# Patient Record
Sex: Male | Born: 1945 | Race: White | Hispanic: No | Marital: Married | Smoking: Never smoker
Health system: Southern US, Community
[De-identification: ages and names within clinical notes are randomized; demographics above are authoritative.]

## PROBLEM LIST (undated history)

## (undated) DIAGNOSIS — E7800 Pure hypercholesterolemia, unspecified: Secondary | ICD-10-CM

## (undated) DIAGNOSIS — I10 Essential (primary) hypertension: Secondary | ICD-10-CM

## (undated) DIAGNOSIS — Z87442 Personal history of urinary calculi: Secondary | ICD-10-CM

## (undated) DIAGNOSIS — R338 Other retention of urine: Secondary | ICD-10-CM

## (undated) DIAGNOSIS — K219 Gastro-esophageal reflux disease without esophagitis: Secondary | ICD-10-CM

## (undated) DIAGNOSIS — Q600 Renal agenesis, unilateral: Secondary | ICD-10-CM

## (undated) DIAGNOSIS — C4490 Unspecified malignant neoplasm of skin, unspecified: Secondary | ICD-10-CM

## (undated) DIAGNOSIS — Z923 Personal history of irradiation: Secondary | ICD-10-CM

## (undated) DIAGNOSIS — M1990 Unspecified osteoarthritis, unspecified site: Secondary | ICD-10-CM

## (undated) DIAGNOSIS — Z978 Presence of other specified devices: Secondary | ICD-10-CM

## (undated) DIAGNOSIS — N210 Calculus in bladder: Secondary | ICD-10-CM

## (undated) DIAGNOSIS — IMO0002 Reserved for concepts with insufficient information to code with codable children: Secondary | ICD-10-CM

## (undated) DIAGNOSIS — C61 Malignant neoplasm of prostate: Secondary | ICD-10-CM

## (undated) DIAGNOSIS — Z86711 Personal history of pulmonary embolism: Secondary | ICD-10-CM

## (undated) DIAGNOSIS — R7303 Prediabetes: Secondary | ICD-10-CM

## (undated) DIAGNOSIS — Z960 Presence of urogenital implants: Secondary | ICD-10-CM

## (undated) HISTORY — PX: NEPHRECTOMY: SHX65

## (undated) HISTORY — PX: SURGERY OF LIP: SUR1315

---

## 2000-04-10 DIAGNOSIS — Z86711 Personal history of pulmonary embolism: Secondary | ICD-10-CM

## 2000-04-10 HISTORY — DX: Personal history of pulmonary embolism: Z86.711

## 2000-04-10 HISTORY — PX: OTHER SURGICAL HISTORY: SHX169

## 2004-06-29 ENCOUNTER — Emergency Department (HOSPITAL_COMMUNITY): Admission: EM | Admit: 2004-06-29 | Discharge: 2004-06-29 | Payer: Self-pay | Admitting: Emergency Medicine

## 2012-03-29 ENCOUNTER — Emergency Department (HOSPITAL_COMMUNITY)
Admission: EM | Admit: 2012-03-29 | Discharge: 2012-03-29 | Disposition: A | Discharge status: Home / Self Care | Payer: Managed Care, Other (non HMO) | Attending: Emergency Medicine | Admitting: Emergency Medicine

## 2012-03-29 ENCOUNTER — Encounter (HOSPITAL_COMMUNITY): Payer: Self-pay | Admitting: Family Medicine

## 2012-03-29 DIAGNOSIS — R350 Frequency of micturition: Secondary | ICD-10-CM | POA: Insufficient documentation

## 2012-03-29 DIAGNOSIS — Z87448 Personal history of other diseases of urinary system: Secondary | ICD-10-CM | POA: Insufficient documentation

## 2012-03-29 DIAGNOSIS — R300 Dysuria: Secondary | ICD-10-CM | POA: Insufficient documentation

## 2012-03-29 DIAGNOSIS — R339 Retention of urine, unspecified: Secondary | ICD-10-CM | POA: Insufficient documentation

## 2012-03-29 DIAGNOSIS — N390 Urinary tract infection, site not specified: Secondary | ICD-10-CM | POA: Insufficient documentation

## 2012-03-29 DIAGNOSIS — Z79899 Other long term (current) drug therapy: Secondary | ICD-10-CM | POA: Insufficient documentation

## 2012-03-29 DIAGNOSIS — I10 Essential (primary) hypertension: Secondary | ICD-10-CM | POA: Insufficient documentation

## 2012-03-29 HISTORY — DX: Essential (primary) hypertension: I10

## 2012-03-29 LAB — URINALYSIS, ROUTINE W REFLEX MICROSCOPIC
Protein, ur: 30 mg/dL — AB
Urobilinogen, UA: 0.2 mg/dL (ref 0.0–1.0)

## 2012-03-29 LAB — URINE MICROSCOPIC-ADD ON

## 2012-03-29 MED ORDER — CIPROFLOXACIN HCL 500 MG PO TABS
500.0000 mg | ORAL_TABLET | Freq: Once | ORAL | Status: AC
  Administered 2012-03-29: 500 mg via ORAL
  Filled 2012-03-29: qty 1

## 2012-03-29 MED ORDER — CIPROFLOXACIN HCL 500 MG PO TABS: 500.0000 mg | ORAL_TABLET | Freq: Two times a day (BID) | ORAL | Status: DC

## 2012-03-29 NOTE — ED Provider Notes (Signed)
History     CSN: [id number]  Arrival date & time 03/29/12  0142   First MD Initiated Contact with Patient 03/29/12 [phone]      Chief Complaint  Patient presents with  . Urinary Retention    (Consider location/radiation/quality/duration/timing/severity/associated sxs/prior treatment) HPI This is a 66 year old male who complains of difficulty urinating beginning yesterday. He characterized it as "squirting small amounts" of urine with urinary frequency. About midnight he became obstructed and was unable to void at all. He developed severe pain in his bladder and came to the ED. A Foley catheter was placed by nursing staff on arrival and he had significant relief of his symptoms. He states he had this occur 3 years ago as a result of her urinary tract infection. He denies fever, chills, nausea, vomiting or diarrhea. He did have some diaphoresis several hours ago. He did have some pain with urination yesterday which makes him think he has a urinary tract infection.  Past Medical History  Diagnosis Date  . Hypertension   . 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 Nephrectomy     No family history on file.  History  Substance Use Topics  . Smoking status: Never Smoker   . Smokeless tobacco: Not on file  . Alcohol Use: Yes     Comment: Occasional      Review of Systems  All other systems reviewed and are negative.    Allergies  Review of patient's allergies indicates no known allergies.  Home Medications   Current Outpatient Rx  Name  Route  Sig  Dispense  Refill  . AMLODIPINE BESYLATE 10 MG PO TABS   Oral   Take 10 mg by mouth daily.         Marland Kitchen TAMSULOSIN HCL 0.4 MG PO CAPS   Oral   Take 0.4 mg by mouth daily after supper.         . TRIAMTERENE-HCTZ 37.5-25 MG PO TABS   Oral   Take 1 tablet by mouth daily.           BP 156/90  Pulse 126  Temp 97.6 F (36.4 C) (Oral)  Resp 24  SpO2 100%  Physical  Exam General: Well-developed, well-nourished male in no acute distress; appearance consistent with age of record HENT: normocephalic, atraumatic Eyes: pupils equal round and reactive to light; extraocular muscles intact Neck: supple Heart: regular rate and rhythm Lungs: clear to auscultation bilaterally Abdomen: soft; nondistended; nontender; bowel sounds present GU: Foley catheter draining dark somewhat cloudy urine Extremities: No deformity; full range of motion Neurologic: Awake, alert and oriented; motor function intact in all extremities and symmetric; no facial droop Skin: Warm and dry Psychiatric: Normal mood and affect    ED Course  Procedures (including critical care time)     MDM   Nursing notes and vitals signs, including pulse oximetry, reviewed.  Summary of this visit's results, reviewed by myself:  Labs:  Results for orders placed during the hospital encounter of 03/29/12 (from the past 24 hour(s))  URINALYSIS, ROUTINE W REFLEX MICROSCOPIC     Status: Abnormal   Collection Time   03/29/12  2:01 AM      Component Value Range   Color, Urine YELLOW  YELLOW   APPearance CLOUDY (*) CLEAR   Specific Gravity, Urine 1.021  1.005 - 1.030   pH 5.5  5.0 - 8.0   Glucose, UA NEGATIVE  NEGATIVE mg/dL   Hgb  urine dipstick MODERATE (*) NEGATIVE   Bilirubin Urine NEGATIVE  NEGATIVE   Ketones, ur NEGATIVE  NEGATIVE mg/dL   Protein, ur 30 (*) NEGATIVE mg/dL   Urobilinogen, UA 0.2  0.0 - 1.0 mg/dL   Nitrite NEGATIVE  NEGATIVE   Leukocytes, UA LARGE (*) NEGATIVE  URINE MICROSCOPIC-ADD ON     Status: Abnormal   Collection Time   03/29/12  2:01 AM      Component Value Range   Squamous Epithelial / LPF FEW (*) RARE   WBC, UA 21-50  <3 WBC/hpf   RBC / HPF 11-20  <3 RBC/hpf   Bacteria, UA MANY (*) RARE   4:32 AM We'll treat for urinary tract infection. Patient will follow up with Dr. Brunilda Payor.          Hanley Seamen, MD 03/29/12 [phone]

## 2012-03-29 NOTE — ED Notes (Signed)
Patient states that he thinks he may have a urinary tract infection. States that for the past couple of hours he has been having urinary retention. Denies low back pain or fever. Had similar episode approx 3 years ago. Foley catheter placed at triage; drainage dark amber urine; specimen sent to lab.

## 2012-03-31 LAB — URINE CULTURE

## 2012-04-01 NOTE — ED Notes (Signed)
+   Urine Patient treated Cipro -sensitive to same-chart appended per protocol MD. 

## 2012-04-29 ENCOUNTER — Other Ambulatory Visit: Payer: Self-pay | Admitting: Urology

## 2012-05-22 ENCOUNTER — Encounter (HOSPITAL_BASED_OUTPATIENT_CLINIC_OR_DEPARTMENT_OTHER): Payer: Self-pay | Admitting: *Deleted

## 2012-05-22 NOTE — Progress Notes (Signed)
NPO AFTER MN. ARRIVES AT 1045. NEEDS ISTAT AND EKG. WILL TAKE NORVASC AM OF SURG W/ SIP OF WATER. REVIEWED [facility] GUIDELINES.

## 2012-05-27 ENCOUNTER — Encounter (HOSPITAL_BASED_OUTPATIENT_CLINIC_OR_DEPARTMENT_OTHER): Payer: Self-pay | Admitting: *Deleted

## 2012-05-27 ENCOUNTER — Encounter (HOSPITAL_BASED_OUTPATIENT_CLINIC_OR_DEPARTMENT_OTHER)
Admission: RE | Disposition: A | Discharge status: Home / Self Care | Payer: Self-pay | Source: Ambulatory Visit | Attending: Urology

## 2012-05-27 ENCOUNTER — Ambulatory Visit (HOSPITAL_BASED_OUTPATIENT_CLINIC_OR_DEPARTMENT_OTHER): Payer: BC Managed Care – PPO | Admitting: Anesthesiology

## 2012-05-27 ENCOUNTER — Encounter (HOSPITAL_BASED_OUTPATIENT_CLINIC_OR_DEPARTMENT_OTHER): Payer: Self-pay | Admitting: Anesthesiology

## 2012-05-27 ENCOUNTER — Ambulatory Visit (HOSPITAL_BASED_OUTPATIENT_CLINIC_OR_DEPARTMENT_OTHER)
Admission: RE | Admit: 2012-05-27 | Discharge: 2012-05-28 | Disposition: A | Discharge status: Home / Self Care | Payer: BC Managed Care – PPO | Source: Ambulatory Visit | Attending: Urology | Admitting: Urology

## 2012-05-27 ENCOUNTER — Other Ambulatory Visit: Payer: Self-pay

## 2012-05-27 DIAGNOSIS — K219 Gastro-esophageal reflux disease without esophagitis: Secondary | ICD-10-CM | POA: Insufficient documentation

## 2012-05-27 DIAGNOSIS — N4289 Other specified disorders of prostate: Secondary | ICD-10-CM | POA: Insufficient documentation

## 2012-05-27 DIAGNOSIS — I10 Essential (primary) hypertension: Secondary | ICD-10-CM | POA: Insufficient documentation

## 2012-05-27 DIAGNOSIS — N138 Other obstructive and reflux uropathy: Secondary | ICD-10-CM | POA: Insufficient documentation

## 2012-05-27 DIAGNOSIS — Z79899 Other long term (current) drug therapy: Secondary | ICD-10-CM | POA: Insufficient documentation

## 2012-05-27 DIAGNOSIS — E7800 Pure hypercholesterolemia, unspecified: Secondary | ICD-10-CM | POA: Insufficient documentation

## 2012-05-27 DIAGNOSIS — N210 Calculus in bladder: Secondary | ICD-10-CM | POA: Insufficient documentation

## 2012-05-27 DIAGNOSIS — N3289 Other specified disorders of bladder: Secondary | ICD-10-CM | POA: Insufficient documentation

## 2012-05-27 DIAGNOSIS — N401 Enlarged prostate with lower urinary tract symptoms: Secondary | ICD-10-CM | POA: Insufficient documentation

## 2012-05-27 DIAGNOSIS — R972 Elevated prostate specific antigen [PSA]: Secondary | ICD-10-CM | POA: Insufficient documentation

## 2012-05-27 DIAGNOSIS — Z8673 Personal history of transient ischemic attack (TIA), and cerebral infarction without residual deficits: Secondary | ICD-10-CM | POA: Insufficient documentation

## 2012-05-27 DIAGNOSIS — R339 Retention of urine, unspecified: Secondary | ICD-10-CM | POA: Insufficient documentation

## 2012-05-27 HISTORY — DX: Calculus in bladder: N21.0

## 2012-05-27 HISTORY — PX: CYSTOSCOPY: SHX5120

## 2012-05-27 HISTORY — PX: TRANSURETHRAL RESECTION OF PROSTATE: SHX73

## 2012-05-27 HISTORY — DX: Personal history of urinary calculi: Z87.442

## 2012-05-27 HISTORY — DX: Presence of urogenital implants: Z96.0

## 2012-05-27 HISTORY — DX: Reserved for concepts with insufficient information to code with codable children: IMO0002

## 2012-05-27 HISTORY — DX: Renal agenesis, unilateral: Q60.0

## 2012-05-27 HISTORY — DX: Gastro-esophageal reflux disease without esophagitis: K21.9

## 2012-05-27 HISTORY — DX: Presence of other specified devices: Z97.8

## 2012-05-27 HISTORY — DX: Other retention of urine: R33.8

## 2012-05-27 HISTORY — DX: Personal history of pulmonary embolism: Z86.711

## 2012-05-27 SURGERY — TRANSURETHRAL RESECTION OF THE PROSTATE WITH GYRUS INSTRUMENTS
Anesthesia: General | Site: Prostate | Wound class: Clean Contaminated

## 2012-05-27 MED ORDER — LACTATED RINGERS IV SOLN
INTRAVENOUS | Status: DC
  Administered 2012-05-27: 11:00:00 via INTRAVENOUS
  Filled 2012-05-27: qty 1000

## 2012-05-27 MED ORDER — DEXTROSE-NACL 5-0.45 % IV SOLN
INTRAVENOUS | Status: DC
  Administered 2012-05-27 – 2012-05-28 (×2): via INTRAVENOUS
  Filled 2012-05-27: qty 1000

## 2012-05-27 MED ORDER — MEPERIDINE HCL 25 MG/ML IJ SOLN
6.2500 mg | INTRAMUSCULAR | Status: DC | PRN
  Filled 2012-05-27: qty 1

## 2012-05-27 MED ORDER — HYDROCODONE-ACETAMINOPHEN 5-325 MG PO TABS
1.0000 | ORAL_TABLET | ORAL | Status: DC | PRN
  Filled 2012-05-27: qty 2

## 2012-05-27 MED ORDER — ZOLPIDEM TARTRATE 5 MG PO TABS
5.0000 mg | ORAL_TABLET | Freq: Every evening | ORAL | Status: DC | PRN
  Filled 2012-05-27: qty 1

## 2012-05-27 MED ORDER — LIDOCAINE HCL 2 % EX GEL
CUTANEOUS | Status: DC | PRN
  Administered 2012-05-27: 1 via URETHRAL

## 2012-05-27 MED ORDER — BELLADONNA ALKALOIDS-OPIUM 16.2-60 MG RE SUPP
RECTAL | Status: DC | PRN
  Administered 2012-05-27: 1 via RECTAL

## 2012-05-27 MED ORDER — ACETAMINOPHEN 10 MG/ML IV SOLN
1000.0000 mg | Freq: Once | INTRAVENOUS | Status: AC | PRN
  Filled 2012-05-27: qty 100

## 2012-05-27 MED ORDER — DEXAMETHASONE SODIUM PHOSPHATE 4 MG/ML IJ SOLN
INTRAMUSCULAR | Status: DC | PRN
  Administered 2012-05-27: 10 mg via INTRAVENOUS

## 2012-05-27 MED ORDER — PROMETHAZINE HCL 25 MG/ML IJ SOLN
6.2500 mg | INTRAMUSCULAR | Status: DC | PRN
  Filled 2012-05-27: qty 1

## 2012-05-27 MED ORDER — ACETAMINOPHEN 325 MG PO TABS
650.0000 mg | ORAL_TABLET | ORAL | Status: DC | PRN
  Filled 2012-05-27: qty 2

## 2012-05-27 MED ORDER — MIDAZOLAM HCL 5 MG/5ML IJ SOLN
INTRAMUSCULAR | Status: DC | PRN
  Administered 2012-05-27: 2 mg via INTRAVENOUS

## 2012-05-27 MED ORDER — SODIUM CHLORIDE 0.9 % IR SOLN
Status: DC | PRN
  Administered 2012-05-27: 30000 mL

## 2012-05-27 MED ORDER — PROPOFOL 10 MG/ML IV BOLUS
INTRAVENOUS | Status: DC | PRN
  Administered 2012-05-27: 60 mg via INTRAVENOUS
  Administered 2012-05-27: 200 mg via INTRAVENOUS

## 2012-05-27 MED ORDER — CIPROFLOXACIN IN D5W 400 MG/200ML IV SOLN
400.0000 mg | INTRAVENOUS | Status: AC
  Administered 2012-05-27: 400 mg via INTRAVENOUS
  Filled 2012-05-27: qty 200

## 2012-05-27 MED ORDER — LIDOCAINE HCL (CARDIAC) 20 MG/ML IV SOLN
INTRAVENOUS | Status: DC | PRN
  Administered 2012-05-27: 100 mg via INTRAVENOUS

## 2012-05-27 MED ORDER — OXYCODONE HCL 5 MG/5ML PO SOLN
5.0000 mg | Freq: Once | ORAL | Status: AC | PRN
  Filled 2012-05-27: qty 5

## 2012-05-27 MED ORDER — LACTATED RINGERS IV SOLN
INTRAVENOUS | Status: DC | PRN
  Administered 2012-05-27 (×3): via INTRAVENOUS

## 2012-05-27 MED ORDER — ONDANSETRON HCL 4 MG/2ML IJ SOLN
INTRAMUSCULAR | Status: DC | PRN
  Administered 2012-05-27: 4 mg via INTRAVENOUS

## 2012-05-27 MED ORDER — CIPROFLOXACIN IN D5W 400 MG/200ML IV SOLN
400.0000 mg | Freq: Two times a day (BID) | INTRAVENOUS | Status: AC
  Administered 2012-05-27: 400 mg via INTRAVENOUS
  Filled 2012-05-27: qty 200

## 2012-05-27 MED ORDER — FENTANYL CITRATE 0.05 MG/ML IJ SOLN
INTRAMUSCULAR | Status: DC | PRN
  Administered 2012-05-27 (×2): 25 ug via INTRAVENOUS
  Administered 2012-05-27: 50 ug via INTRAVENOUS
  Administered 2012-05-27 (×4): 25 ug via INTRAVENOUS
  Administered 2012-05-27 (×2): 50 ug via INTRAVENOUS

## 2012-05-27 MED ORDER — HYDROMORPHONE HCL PF 1 MG/ML IJ SOLN
0.2500 mg | INTRAMUSCULAR | Status: DC | PRN
  Filled 2012-05-27: qty 1

## 2012-05-27 MED ORDER — OXYCODONE HCL 5 MG PO TABS
5.0000 mg | ORAL_TABLET | Freq: Once | ORAL | Status: AC | PRN
  Filled 2012-05-27: qty 1

## 2012-05-27 MED ORDER — BELLADONNA ALKALOIDS-OPIUM 16.2-60 MG RE SUPP
1.0000 | Freq: Four times a day (QID) | RECTAL | Status: DC | PRN
  Filled 2012-05-27: qty 1

## 2012-05-27 MED ORDER — KETOROLAC TROMETHAMINE 30 MG/ML IJ SOLN
INTRAMUSCULAR | Status: DC | PRN
  Administered 2012-05-27: 30 mg via INTRAVENOUS

## 2012-05-27 SURGICAL SUPPLY — 40 items
BAG DRAIN URO-CYSTO SKYTR STRL (DRAIN) ×3 IMPLANT
BAG DRN ANRFLXCHMBR STRAP LEK (BAG)
BAG DRN UROCATH (DRAIN) ×2
BAG URINE DRAINAGE (UROLOGICAL SUPPLIES) IMPLANT
BAG URINE LEG 19OZ MD ST LTX (BAG) IMPLANT
CANISTER SUCT LVC 12 LTR MEDI- (MISCELLANEOUS) ×3 IMPLANT
CATH FOLEY 2WAY SLVR  5CC 20FR (CATHETERS)
CATH FOLEY 2WAY SLVR  5CC 22FR (CATHETERS)
CATH FOLEY 2WAY SLVR 5CC 20FR (CATHETERS) IMPLANT
CATH FOLEY 2WAY SLVR 5CC 22FR (CATHETERS) IMPLANT
CATH FOLEY 3WAY 30CC 24FR (CATHETERS)
CATH HEMA 3WAY 30CC 24FR COUDE (CATHETERS) IMPLANT
CATH HEMA 3WAY 30CC 24FR RND (CATHETERS) IMPLANT
CATH URTH STD 24FR FL 3W 2 (CATHETERS) IMPLANT
CLOTH BEACON ORANGE TIMEOUT ST (SAFETY) ×3 IMPLANT
DRAPE CAMERA CLOSED 9X96 (DRAPES) ×1 IMPLANT
ELECT LOOP MED HF 24F 12D (CUTTING LOOP) ×1 IMPLANT
ELECT LOOP MED HF 24F 12D CBL (CLIP) ×3 IMPLANT
ELECT REM PT RETURN 9FT ADLT (ELECTROSURGICAL)
ELECT RESECT VAPORIZE 12D CBL (ELECTRODE) ×3 IMPLANT
ELECTRODE REM PT RTRN 9FT ADLT (ELECTROSURGICAL) ×2 IMPLANT
EVACUATOR MICROVAS BLADDER (UROLOGICAL SUPPLIES) ×3 IMPLANT
GLOVE BIO SURGEON STRL SZ7 (GLOVE) ×3 IMPLANT
GLOVE BIOGEL PI IND STRL 6.5 (GLOVE) IMPLANT
GLOVE BIOGEL PI IND STRL 7.0 (GLOVE) IMPLANT
GLOVE BIOGEL PI INDICATOR 6.5 (GLOVE) ×1
GLOVE BIOGEL PI INDICATOR 7.0 (GLOVE) ×1
HOLDER FOLEY CATH W/STRAP (MISCELLANEOUS) IMPLANT
IV NS IRRIG 3000ML ARTHROMATIC (IV SOLUTION) ×10 IMPLANT
KIT ASPIRATION TUBING (SET/KITS/TRAYS/PACK) IMPLANT
NDL SAFETY ECLIPSE 18X1.5 (NEEDLE) IMPLANT
NEEDLE HYPO 18GX1.5 SHARP (NEEDLE)
NEEDLE HYPO 22GX1.5 SAFETY (NEEDLE) IMPLANT
NS IRRIG 500ML POUR BTL (IV SOLUTION) IMPLANT
PACK CYSTOSCOPY (CUSTOM PROCEDURE TRAY) ×3 IMPLANT
PLUG CATH AND CAP STER (CATHETERS) IMPLANT
SET ASPIRATION TUBING (TUBING) ×1 IMPLANT
SYR 20CC LL (SYRINGE) IMPLANT
SYR 30ML LL (SYRINGE) IMPLANT
WATER STERILE IRR 3000ML UROMA (IV SOLUTION) ×2 IMPLANT

## 2012-05-27 NOTE — Anesthesia Preprocedure Evaluation (Addendum)
Anesthesia Evaluation  Patient identified by MRN, date of birth, ID band Patient awake    Reviewed: Allergy & Precautions, H&P , NPO status , Patient's Chart, lab work & pertinent test results  Airway Mallampati: II TM Distance: >3 FB Neck ROM: Full    Dental  (+) Dental Advisory Given and Teeth Intact   Pulmonary PE breath sounds clear to auscultation  Pulmonary exam normal       Cardiovascular hypertension, Pt. on medications negative cardio ROS  Rhythm:Regular Rate:Normal     Neuro/Psych negative neurological ROS  negative psych ROS   GI/Hepatic negative GI ROS, Neg liver ROS, GERD-  ,  Endo/Other  negative endocrine ROS  Renal/GU negative Renal ROS     Musculoskeletal negative musculoskeletal ROS (+)   Abdominal   Peds  Hematology negative hematology ROS (+)   Anesthesia Other Findings   Reproductive/Obstetrics                         Anesthesia Physical Anesthesia Plan  ASA: II  Anesthesia Plan: General   Post-op Pain Management:    Induction: Intravenous  Airway Management Planned: LMA  Additional Equipment:   Intra-op Plan:   Post-operative Plan: Extubation in OR  Informed Consent: I have reviewed the patients History and Physical, chart, labs and discussed the procedure including the risks, benefits and alternatives for the proposed anesthesia with the patient or authorized representative who has indicated his/her understanding and acceptance.   Dental advisory given  Plan Discussed with: CRNA  Anesthesia Plan Comments:         Anesthesia Quick Evaluation

## 2012-05-27 NOTE — Progress Notes (Signed)
Post-Op rounds Afebrile.  V/S stable. Tolerates diet well. Abdomen: soft, non distended, non tender. Foley draining well.  Urine rose. Plan: D/C CBI. Remove Foley in AM.

## 2012-05-27 NOTE — Progress Notes (Signed)
Assessment completed. Patient alert able to verbalize needs  Foley patent draining clear pink urine. Taking p.o"s ok. Call light in reach

## 2012-05-27 NOTE — Transfer of Care (Signed)
Immediate Anesthesia Transfer of Care Note  Patient: Benjamin Massey  Procedure(s) Performed: Procedure(s) (LRB): TRANSURETHRAL RESECTION OF THE PROSTATE WITH GYRUS INSTRUMENTS (N/A) CYSTOSCOPY (N/A)  Patient Location: PACU  Anesthesia Type: General  Level of Consciousness: awake, sedated, patient cooperative and responds to stimulation  Airway & Oxygen Therapy: Patient Spontanous Breathing and Patient connected to face mask oxygen  Post-op Assessment: Report given to PACU RN, Post -op Vital signs reviewed and stable and Patient moving all extremities  Post vital signs: Reviewed and stable  Complications: No apparent anesthesia complications

## 2012-05-27 NOTE — Anesthesia Procedure Notes (Signed)
Procedure Name: LMA Insertion Date/Time: 05/27/2012 12:19 PM Performed by: Jessica Priest Pre-anesthesia Checklist: Patient identified, Emergency Drugs available, Suction available and Patient being monitored Patient Re-evaluated:Patient Re-evaluated prior to inductionOxygen Delivery Method: Circle System Utilized Preoxygenation: Pre-oxygenation with 100% oxygen Intubation Type: IV induction Ventilation: Mask ventilation without difficulty LMA: LMA inserted LMA Size: 4.0 Number of attempts: 1 Airway Equipment and Method: bite block Placement Confirmation: positive ETCO2 Tube secured with: Tape Dental Injury: Teeth and Oropharynx as per pre-operative assessment

## 2012-05-27 NOTE — Anesthesia Postprocedure Evaluation (Signed)
Anesthesia Post Note  Patient: Benjamin Massey  Procedure(s) Performed: Procedure(s) (LRB): TRANSURETHRAL RESECTION OF THE PROSTATE WITH GYRUS INSTRUMENTS (N/A) CYSTOSCOPY (N/A)  Anesthesia type: General  Patient location: PACU  Post pain: Pain level controlled  Post assessment: Patient's Cardiovascular Status Stable  Last Vitals:  Filed Vitals:   05/27/12 1530  BP: 133/71  Pulse: 60  Temp:   Resp: 14    Post vital signs: Reviewed and stable  Level of consciousness: alert  Complications: No apparent anesthesia complications

## 2012-05-27 NOTE — Op Note (Signed)
CACE OSORTO is a 67 y.o.   05/27/2012  Preop diagnosis: Urinary retention, BPH, bladder calculus  To stop diagnosis: Same  Procedure done: Cystoscopy, removal of bladder calculus, TURP  Surgeon: Wendie Simmer. Naydeline Morace  Anesthesia: General  Indication: Patient is a 67 years old male who went into urinary retention about 2 months ago. He was on tamsulosin and failed several voiding trials. Cystoscopy showed a calculus in the prostatic urethra that was pushed back in the bladder. He also has trilobar prostatic hypertrophy. The bladder is heavily trabeculated. Urodynamic studies showed strong bladder contraction. The patient is scheduled today for cystoscopy TURP and removal of bladder stone.  Procedure: The patient was identified by his wrist band and proper timeout was taken.  Under general anesthesia he was prepped and draped and placed in the dorsolithotomy position. A panendoscope was inserted in the bladder. The anterior urethra is normal. There is trilobar prostatic hypertrophy with obstruction of the bladder neck. The bladder is heavily trabeculated with cellules and diverticula. There is a 1.2 cm stone in the bladder. The ureteral orifices are in normal position and shape. There is no tumor in the bladder. The cystoscope was removed. The urethra was dilated up to #30 [place] with Von Buren sounds. A gyrus resectoscope was then inserted in the bladder. An attempt was made to remove the stone with the resectoscope loop. In the process the stone was broken up into fragments. Both fragments were removed with the resectoscope without difficulty.  Resection of the prostate gland was done between the 5 and 7:00 position using the bladder neck and the veru montanum as landmarks. Then the resection was done between the 7 and the 10:00 position and between the 2 and 5:00 positions using the same landmarks. Resection was then completed between the 10 and 2:00 positions. Hemostasis was secured with  electrocautery. The prostatic chips were then irrigated out of the bladder. With the resectoscope and the verumontanum the bladder neck is wide open. There were no remaining chips in the bladder. There was minimal bleeding at the end of the procedure. The resectoscope was then removed. A #24 three-way Foley catheter was then inserted in the bladder.  EBL: 100 cc  The patient tolerated the procedure well and left the OR in satisfactory condition to postanesthesia care unit.

## 2012-05-27 NOTE — H&P (Signed)
History of Present Illness  Benjamin Massey has an indwelling Foley catheter for urinary retention.  He failed several voiding trials.  Urodynamics studies showed involuntary bladder contractions but he was unable to void.  Catheter was reinserted after the study.  Urine culture a week ago was positive for enterobacter and he is currently on Cipro.  Cystoscopy revealed trilobar prostatic hypertrophy and a bladder calculus.  He is scheduled for bladder stone removal and TURP next week.  The procedure, risks, benefits were discussed with the patient.  The risks include but are not limited to hemorrhage, infection, urinary incontinence, bladder injury, inability to urinate after the procedure.  he understands and wishes to proceed.  Will repeat urine culture.   Past Medical History Problems  1. History of  Esophageal Reflux 530.81 2. History of  Hypercholesterolemia 272.0 3. History of  Hypertension 401.9 4. History of  PSA,Elevated 790.93 5. History of  Pulmonary Embolism V12.51  Surgical History Problems  1. History of  Nephrectomy  Current Meds 1. Advil TABS; Therapy: (Recorded:06Jan2012) to 2. AmLODIPine Besylate 10 MG Oral Tablet; Therapy: 24May2012 to 3. Asacol HD 800 MG Oral Tablet Delayed Release; Therapy: 10Jul2012 to 4. Avodart 0.5 MG Oral Capsule; Therapy: 09Jan2014 to 5. Ciprofloxacin HCl 500 MG Oral Tablet; 1BID - TAKE ONE TABLET BY MOUTH TWICE DAILY;  Therapy: 07Feb2014 to (Evaluate:14Feb2014)  Requested for: 07Feb2014; Last Rx:07Feb2014 6. Hydrochlorothiazide 25 MG Oral Tablet; Therapy: 29May2012 to 7. Lunesta 2 MG Oral Tablet; Therapy: 29May2012 to 8. Tamsulosin HCl 0.4 MG Oral Capsule; TAKE 1 CAPSULE DAILY AT BEDTIME; Therapy:  27Dec2011 to (Last Rx:13May2013)  Requested for: 13May2013 9. Trilipix 135 MG Oral Capsule Delayed Release; Therapy: 29Feb2012 to 10. Tylenol CAPS; Therapy: (Recorded:29Jan2008) to  Allergies Medication  1. No Known Drug Allergies  Family  History Problems  1. Paternal history of  Diabetes Mellitus V18.0 2. Family history of  Family Health Status Number Of Children 3 daughters 3. Paternal history of  Hypertension  Social History Problems  1. Family history of  Death In The Family Father 37 2. Marital History - Currently Married 3. Never A Smoker Denied  4. Alcohol Use 5. Tobacco Use  Review of Systems Genitourinary, constitutional, skin, eye, otolaryngeal, hematologic/lymphatic, cardiovascular, pulmonary, endocrine, musculoskeletal, gastrointestinal, neurological and psychiatric system(s) were reviewed and pertinent findings if present are noted.  Uanble to void.    Physical Exam Constitutional: Well nourished and well developed . No acute distress.  ENT:. The ears and nose are normal in appearance.  Neck: The appearance of the neck is normal and no neck mass is present.  Pulmonary: No respiratory distress and normal respiratory rhythm and effort.  Cardiovascular: Heart rate and rhythm are normal . No peripheral edema.  Abdomen: The abdomen is soft and nontender. No masses are palpated. No CVA tenderness. No hernias are palpable. No hepatosplenomegaly noted.  Rectal: Rectal exam demonstrates normal sphincter tone Amended By: Su Grand; 05/22/2012 11:41 AMEST. Estimated prostate size is 3+. Amended By: Su Grand; 05/22/2012 11:41 AMEST. The prostate has no nodularity Amended By: Su Grand; 05/22/2012 11:41 AMEST  and is not indurated Amended By: Su Grand; 05/22/2012 11:41 AMEST. The perineum is normal on inspection Amended By: Su Grand; 05/22/2012 11:41 AMEST.  Genitourinary: Examination of the penis demonstrates no discharge, no masses, no lesions and a normal meatus. The scrotum is without lesions. The right epididymis is palpably normal and non-tender. The left epididymis is palpably normal and non-tender. The right testis is non-tender and without masses. The  left testis is non-tender and without masses.   Lymphatics: The femoral and inguinal nodes are not enlarged or tender.  Skin: Normal skin turgor, no visible rash and no visible skin lesions.  Neuro/Psych:. Mood and affect are appropriate.    Assessment Assessed  1. Acute Urinary Retention 788.20 2. Benign Prostatic Hypertrophy With Urinary Obstruction 600.01 3. Bladder Calculus 594.1  Plan  Urine culture.  Cysto TURP, cystolapaxy.   Signatures Electronically signed by : Su Grand, M.D.; May 22 2012 11:41AM

## 2012-05-28 ENCOUNTER — Encounter (HOSPITAL_BASED_OUTPATIENT_CLINIC_OR_DEPARTMENT_OTHER): Payer: Self-pay | Admitting: Urology

## 2012-05-28 LAB — CBC
HCT: 41.1 % (ref 39.0–52.0)
Hemoglobin: 13.6 g/dL (ref 13.0–17.0)
MCHC: 33.1 g/dL (ref 30.0–36.0)
Platelets: 446 10*3/uL — ABNORMAL HIGH (ref 150–400)
RBC: 4.89 MIL/uL (ref 4.22–5.81)
RDW: 13.7 % (ref 11.5–15.5)

## 2012-05-28 LAB — BASIC METABOLIC PANEL
Chloride: 96 mEq/L (ref 96–112)
Creatinine, Ser: 0.99 mg/dL (ref 0.50–1.35)
GFR calc Af Amer: >90 mL/min (ref >90)
GFR calc non Af Amer: 85 mL/min — ABNORMAL LOW (ref >90)
Potassium: 3.5 mEq/L (ref 3.5–5.1)

## 2012-05-28 MED ORDER — CIPROFLOXACIN HCL 500 MG PO TABS: 500.0000 mg | ORAL_TABLET | Freq: Two times a day (BID) | ORAL | Status: DC

## 2012-05-28 NOTE — Discharge Summary (Signed)
Patient ID: Benjamin Massey MRN: [id number] DOB/AGE: Mar 06, 1946 67 y.o.  Admit date: 05/27/2012 Discharge date: 05/28/2012  Admission Diagnoses: Acute Urinary Retention, Baldder Stone  Discharge Diagnoses: Same Active Problems:   * No active hospital problems. *   Discharged Condition: Improved  Hospital Course: [name] had a TURP, cysto litholapaxy on 2/17 for urinary retention and bladder calculus. His postoperative course was uneventful. He remained afebrile. The Foley catheter was removed this morning. After removing the Foley catheter he voided on his own. His urine is pinkish. He is discharged home to be followed in the office. The stone will be sent out for analysis. He is instructed not to do any lifting, straining or driving for 2-3 weeks.  Treatments: Cysto, TURP, removal bladder calculus  Discharge Exam: Blood pressure 120/66, pulse 90, temperature 97.9 F (36.6 C), temperature source Oral, resp. rate 20, height 5\' 5"  (1.651 m), weight 155 lb (70.308 kg), SpO2 95.00%. Abdomen: Soft, non distended, non tender.   Disposition: 01-Home or Self Care     Medication List    TAKE these medications       amLODipine 10 MG tablet  Commonly known as:  NORVASC  Take 10 mg by mouth every morning.     calcium carbonate 500 MG chewable tablet  Commonly known as:  TUMS - dosed in mg elemental calcium  Chew 1 tablet by mouth daily.     ciprofloxacin 500 MG tablet  Commonly known as:  CIPRO  Take 1 tablet (500 mg total) by mouth 2 (two) times daily.     ciprofloxacin 500 MG tablet  Commonly known as:  CIPRO  Take 1 tablet (500 mg total) by mouth 2 (two) times daily.     triamterene-hydrochlorothiazide 37.5-25 MG per tablet  Commonly known as:  MAXZIDE-25  Take 1 tablet by mouth every morning.         Signed: Natalyia Innes-HENRY 05/28/2012, 8:37 AM

## 2012-05-28 NOTE — Progress Notes (Signed)
Patient resting quietly easy to arouse no acute changes noted.

## 2012-12-26 ENCOUNTER — Other Ambulatory Visit: Payer: Self-pay | Admitting: Family Medicine

## 2012-12-26 DIAGNOSIS — M7989 Other specified soft tissue disorders: Secondary | ICD-10-CM

## 2013-01-03 ENCOUNTER — Other Ambulatory Visit: Payer: Self-pay | Admitting: Family Medicine

## 2013-01-03 ENCOUNTER — Ambulatory Visit
Admission: RE | Admit: 2013-01-03 | Discharge: 2013-01-03 | Disposition: A | Discharge status: Home / Self Care | Payer: BC Managed Care – PPO | Source: Ambulatory Visit | Attending: Family Medicine | Admitting: Family Medicine

## 2013-01-03 DIAGNOSIS — M7989 Other specified soft tissue disorders: Secondary | ICD-10-CM

## 2013-12-01 ENCOUNTER — Other Ambulatory Visit (HOSPITAL_COMMUNITY): Payer: Self-pay | Admitting: Family Medicine

## 2013-12-01 ENCOUNTER — Ambulatory Visit (HOSPITAL_COMMUNITY)
Admission: RE | Admit: 2013-12-01 | Discharge: 2013-12-01 | Disposition: A | Discharge status: Home / Self Care | Payer: BC Managed Care – PPO | Source: Ambulatory Visit | Attending: Family Medicine | Admitting: Family Medicine

## 2013-12-01 DIAGNOSIS — I82819 Embolism and thrombosis of superficial veins of unspecified lower extremities: Secondary | ICD-10-CM | POA: Insufficient documentation

## 2013-12-01 DIAGNOSIS — M7989 Other specified soft tissue disorders: Secondary | ICD-10-CM

## 2013-12-01 NOTE — Progress Notes (Signed)
*  PRELIMINARY RESULTS* Vascular Ultrasound Right lower extremity venous duplex has been completed.  Preliminary findings: No evidence of DVT. 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   Called results to Dr. Justin Mend.   Landry Mellow, RDMS, RVT  12/01/2013, 4:42 PM

## 2014-07-30 HISTORY — PX: PROSTATE BIOPSY: SHX241

## 2014-08-17 NOTE — Progress Notes (Signed)
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 PSA at time of diagnosis was 5.92  TURP in 05/2012 for urinary retention.   Repeat Biopsy on 07/29/14 with Gleason 6 in <5% to 40% of 4 cores at the left mid gland and left apex.  According to the [place] nomogrms he has 55% probability of organ confined disease, 45% risk of extracapsular extension, 1% risk of seminal vesicle invasion, 1 % risk of lymph node involvement.  He is still interested in surveillance, but willing to have a consultation with Radiation Oncology.  Past/Anticipated interventions by urology, if any: Dr. Lowella Bandy- Biopsy of Prostate and TURP  Past/Anticipated interventions by medical oncology, if any: None  Weight changes, if any: None  Bowel/Bladder complaints, if any:  TURP in 05/2012 for urinary retention.   Nausea/Vomiting, if any: None  Pain issues, if any: None  SAFETY ISSUES:  Prior radiation? No  Pacemaker/ICD? No  Possible current pregnancy? N/A  Is the patient on methotrexate? No  Current Complaints / other details:    Nocturia x 1

## 2014-08-18 ENCOUNTER — Ambulatory Visit
Admission: RE | Admit: 2014-08-18 | Discharge: 2014-08-18 | Disposition: A | Discharge status: Home / Self Care | Payer: BLUE CROSS/BLUE SHIELD | Source: Ambulatory Visit | Attending: Radiation Oncology | Admitting: Radiation Oncology

## 2014-08-18 ENCOUNTER — Encounter: Payer: Self-pay | Admitting: Radiation Oncology

## 2014-08-18 VITALS — BP 137/69 | HR 76 | Temp 97.8°F | Ht 65.0 in | Wt 165.3 lb

## 2014-08-18 DIAGNOSIS — C61 Malignant neoplasm of prostate: Secondary | ICD-10-CM

## 2014-08-18 HISTORY — DX: Pure hypercholesterolemia, unspecified: E78.00

## 2014-08-18 HISTORY — DX: Malignant neoplasm of prostate: C61

## 2014-08-18 HISTORY — DX: Gastro-esophageal reflux disease without esophagitis: K21.9

## 2014-08-18 NOTE — Progress Notes (Signed)
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 male who is seen today through the courtesy of Dr. 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  He presented with an elevated PSA of 24.6 while in urinary retention back in January 2014.  He underwent a TURP with excellent flow dynamics following surgery.  Pathology showed acute and chronic granulomatous inflammation.  However, his PSA remained elevated at 5.9.  He underwent ultrasound-guided biopsies on 06/06/2013 with the diagnosis of Gleason 6 (3+3) involving 40% of one core from the left lateral mid gland.  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  He elected for active surveillance.  His PSA rose to 6.98 on 06/22/2014 a repeat prostate biopsies revealed Gleason 6 (3+3) involving less than 5% of one core from left lateral mid gland, 10% of one core from the left mid gland, 40% of one core from the left lateral apex and 10% of one core from the left apex.  His gland volume was 35.4 mL.  He remains interested in continued active surveillance but is seen today for discussion of radiation therapy options.  He is doing well from a GU and GI standpoint.  His I PSS score is 5.  He does have erectile dysfunction.  PREVIOUS RADIATION THERAPY: No   PAST MEDICAL HISTORY:  has a past medical history of Hypertension; Solitary kidney; History of kidney stones; Bladder stone; Acute urinary retention; Foley catheter in place; History of pulmonary embolus (PE) (2002); Mild acid reflux; Prostate cancer; Esophageal reflux; and  Hypercholesteremia.     PAST SURGICAL HISTORY:  Past Surgical History  Procedure Laterality Date  . Nephrectomy  AGE 84    RIGHT KIDNEY--  NO URETER  . Ureteroscopic stone extraction w/ stent placement  2002  . Transurethral resection of prostate N/A 05/27/2012    Procedure: TRANSURETHRAL RESECTION OF THE PROSTATE WITH GYRUS INSTRUMENTS;  Surgeon: Hanley Ben, MD;  Location: [place];  Service: Urology;  Laterality: N/A;  . Cystoscopy N/A 05/27/2012    Procedure: CYSTOSCOPY;  Surgeon: Hanley Ben, MD;  Location: [facility];  Service: Urology;  Laterality: N/A;  REMOVAL OF BLADDER STONE  . Prostate biopsy  07/30/2014     FAMILY HISTORY: family history includes Diabetes in his father; Hypertension in his father. His father died from complications of Alzheimer's disease and diabetes mellitus at age 67.  His mother is alive and well at 9.  No family history of prostate cancer.   SOCIAL HISTORY:  reports that he has never smoked. He has never used smokeless tobacco. He reports that he does not drink alcohol or use illicit drugs.  Married, 3 children.  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   ALLERGIES: Review of patient's allergies indicates not on file.   MEDICATIONS:  Current Outpatient Prescriptions  Medication Sig Dispense Refill  . acetaminophen (TYLENOL) 325 MG tablet Take 650 mg by mouth every 6 (six) hours as needed.    Marland Kitchen amLODipine (NORVASC) 10 MG tablet Take 10  mg by mouth every morning.     . calcium carbonate (TUMS - DOSED IN MG ELEMENTAL CALCIUM) 500 MG chewable tablet Chew 1 tablet by mouth daily.    . COCONUT OIL PO Take 1 tablet by mouth 2 (two) times daily.    . eszopiclone (LUNESTA) 2 MG TABS tablet Take 2 mg by mouth at bedtime as needed for sleep. Take immediately before bedtime 1mg  at bedtime    . MAGNESIUM CITRATE PO Take 1 tablet by mouth daily.    . mesalamine (ASACOL) 400 MG EC tablet Take 400 mg by  mouth 3 (three) times daily.    . Multiple Vitamins-Minerals (MULTI COMPLETE PO) Take 1 tablet by mouth daily.    Marland Kitchen triamterene-hydrochlorothiazide (MAXZIDE-25) 37.5-25 MG per tablet Take 1 tablet by mouth every morning.     . Choline Fenofibrate 135 MG capsule Take 135 mg by mouth daily.     No current facility-administered medications for this encounter.     REVIEW OF SYSTEMS:  Pertinent items are noted in HPI.    PHYSICAL EXAM:  height is 5\' 5"  (1.651 m) and weight is 165 lb 4.8 oz (74.98 kg). His temperature is 97.8 F (36.6 C). His blood pressure is 137/69 and his pulse is 76.   Alert and oriented 69 year old white male appearing his stated age.  Head and neck examination: Grossly unremarkable.  Nodes: Without palpable cervical or supraclavicular lymphadenopathy.  Chest: Lungs clear.  Abdomen: Without masses organomegaly.  Genitalia: Unremarkable to inspection.  Rectal: The prostate gland is normal size and is without focal induration or nodularity.   LABORATORY DATA:  Lab Results  Component Value Date   WBC 18.7* 05/28/2012   HGB 13.6 05/28/2012   HCT 41.1 05/28/2012   MCV 84.0 05/28/2012   PLT 446* 05/28/2012   Lab Results  Component Value Date   NA 132* 05/28/2012   K 3.5 05/28/2012   CL 96 05/28/2012   CO2 17* 05/28/2012   No results found for: ALT, AST, GGT, ALKPHOS, BILITOT    IMPRESSION: Clinical stageT1c favorable risk adenocarcinoma prostate.  I explained to the patient that his prognosis is related to his stage, Gleason score, and PSA level.  All are favorable.  Other prognostic factors include PSA doubling time and also disease volume.  Strictly speaking, his PSA doubling time is less than 6 months, but I feel that he probably has some chronic inflammation/infection that is responsible for his recent rise in PSA.  I understand that he will be started on antibiotics and then have a repeat PSA, according to the patient.  His disease volume is favorable.  I explained  to him that active surveillance is a good option for patients over age 63.  However, we would certainly offer potentially curative treatment, whether it be surgery or radiation therapy in patients who are in their mid 18s provided that the patient has a 10 year life expectancy.  We discussed the fact that death from prostate cancer may very well be 10-15 years down the road, and with his life expectancy he may very well have to deal with metastatic prostate cancer.  We discussed the potential acute and late toxicities of radiation therapy.  Seed implantation alone may or may not be an option after a TURP.  We also discussed external beam/IMRT.  Although there is limited median follow up (5.9 years),  the RTOG is reporting non-inferiority of hypofractionated radiation therapy compared to conventional fractionation for patients with favorable risk disease.  The patients received 7000 cGy in 28 sessions over 5.6 weeks.  This could be an option for him.  At this time he favors continued active surveillance, but this could change depending on his next PSA reading after a course of antibiotics.  I more than happy see him again in the future.  He will maintain his follow-up through Dr. Janice Norrie and his associates at [facility].  PLAN:  as discussed above.  I spent 60  minutes face to face with the patient and more than 50% of that time was spent in counseling and/or coordination of care.

## 2014-08-19 NOTE — Addendum Note (Signed)
Encounter addended by: Benn Moulder, RN on: 08/19/2014  9:21 AM<BR>     Documentation filed: Charges VN

## 2014-09-04 ENCOUNTER — Telehealth: Payer: Self-pay | Admitting: *Deleted

## 2014-11-02 ENCOUNTER — Telehealth: Payer: Self-pay | Admitting: *Deleted

## 2014-11-02 NOTE — Telephone Encounter (Signed)
CALLED PATIENT PER DR. Valere Dross REQUEST TO INFORM THAT HE NEEDS TO GO TO DR. NESI' OFFICE FOR FU PSA, LVM FOR A RETURN CALL

## 2015-03-06 ENCOUNTER — Emergency Department (HOSPITAL_COMMUNITY)
Admission: EM | Admit: 2015-03-06 | Discharge: 2015-03-06 | Disposition: A | Discharge status: Home / Self Care | Payer: BLUE CROSS/BLUE SHIELD | Attending: Emergency Medicine | Admitting: Emergency Medicine

## 2015-03-06 ENCOUNTER — Encounter (HOSPITAL_COMMUNITY): Payer: Self-pay | Admitting: Emergency Medicine

## 2015-03-06 DIAGNOSIS — N390 Urinary tract infection, site not specified: Secondary | ICD-10-CM | POA: Diagnosis not present

## 2015-03-06 DIAGNOSIS — Z87718 Personal history of other specified (corrected) congenital malformations of genitourinary system: Secondary | ICD-10-CM | POA: Diagnosis not present

## 2015-03-06 DIAGNOSIS — E7800 Pure hypercholesterolemia, unspecified: Secondary | ICD-10-CM | POA: Insufficient documentation

## 2015-03-06 DIAGNOSIS — Z79899 Other long term (current) drug therapy: Secondary | ICD-10-CM | POA: Insufficient documentation

## 2015-03-06 DIAGNOSIS — I10 Essential (primary) hypertension: Secondary | ICD-10-CM | POA: Insufficient documentation

## 2015-03-06 DIAGNOSIS — R339 Retention of urine, unspecified: Secondary | ICD-10-CM | POA: Diagnosis present

## 2015-03-06 DIAGNOSIS — Z8546 Personal history of malignant neoplasm of prostate: Secondary | ICD-10-CM | POA: Diagnosis not present

## 2015-03-06 DIAGNOSIS — Z87442 Personal history of urinary calculi: Secondary | ICD-10-CM | POA: Insufficient documentation

## 2015-03-06 DIAGNOSIS — Z86711 Personal history of pulmonary embolism: Secondary | ICD-10-CM | POA: Diagnosis not present

## 2015-03-06 DIAGNOSIS — K219 Gastro-esophageal reflux disease without esophagitis: Secondary | ICD-10-CM | POA: Diagnosis not present

## 2015-03-06 LAB — URINE MICROSCOPIC-ADD ON
Bacteria, UA: NONE SEEN
SQUAMOUS EPITHELIAL / LPF: NONE SEEN

## 2015-03-06 LAB — URINALYSIS, ROUTINE W REFLEX MICROSCOPIC
BILIRUBIN URINE: NEGATIVE
Glucose, UA: NEGATIVE mg/dL
Ketones, ur: NEGATIVE mg/dL
Nitrite: NEGATIVE
Protein, ur: NEGATIVE mg/dL
SPECIFIC GRAVITY, URINE: 1.007 (ref 1.005–1.030)
pH: 7 (ref 5.0–8.0)

## 2015-03-06 MED ORDER — CEPHALEXIN 500 MG PO CAPS: 1000.0000 mg | ORAL_CAPSULE | Freq: Two times a day (BID) | ORAL | Status: DC

## 2015-03-06 NOTE — ED Notes (Signed)
Patient c/o difficulty voiding. Started at 1100 yesterday. Patient is only getting small amounts of urine when he is able to void. Patient states he has had this problem before, and is a patient at [facility].

## 2015-03-06 NOTE — ED Notes (Signed)
Tech sent form for urine culture/ urine already in lab

## 2015-03-06 NOTE — ED Provider Notes (Signed)
CSN: [id number]     Arrival date & time 03/06/15  [id number] History   First MD Initiated Contact with Patient 03/06/15 0932     Chief Complaint  Patient presents with  . Urinary Retention     (Consider location/radiation/quality/duration/timing/severity/associated sxs/prior Treatment) HPI   69 year old male with history of urinary retention, history of bladder stones, history of prostate cancer and also history of solitary kidney s/p R nephrectomy age 69 is presenting with difficulty voiding. Patient reports since yesterday afternoon he has notice increased urinary frequency with a weak stream. Symptom has been persistent which cause patient's concern. Otherwise patient denies any fever, chills, lightheadedness, dizziness, back pain, abdominal pain, burning urination, hematuria, testicular pain or rash. He developed similar symptoms in the past and was diagnosed with a urinary tract infection therefore he decided to come in early to prevent complications. He was diagnosed with prostate cancer several years ago without any specific treatment. States that otherwise urology is currently monitoring his prostates. He denies having any pain with having bowel movement. Patient has no other complaint.  Past Medical History  Diagnosis Date  . Hypertension   . Solitary kidney     s/p right nephrectomy  age 75 right ureter absent  . History of kidney stones   . Bladder stone   . Acute urinary retention   . Foley catheter in place   . History of pulmonary embolus (PE) 2002    BILATERAL--  TX W/ COUMADIN  . Mild acid reflux   . Prostate cancer ([place])   . Esophageal reflux   . Hypercholesteremia    Past Surgical History  Procedure Laterality Date  . Nephrectomy  AGE 68    RIGHT KIDNEY--  NO URETER  . Ureteroscopic stone extraction w/ stent placement  2002  . Transurethral resection of prostate N/A 05/27/2012    Procedure: TRANSURETHRAL RESECTION OF THE PROSTATE WITH GYRUS INSTRUMENTS;  Surgeon:  Hanley Ben, MD;  Location: [place];  Service: Urology;  Laterality: N/A;  . Cystoscopy N/A 05/27/2012    Procedure: CYSTOSCOPY;  Surgeon: Hanley Ben, MD;  Location: [facility];  Service: Urology;  Laterality: N/A;  REMOVAL OF BLADDER STONE  . Prostate biopsy  07/30/2014   Family History  Problem Relation Age of Onset  . Diabetes Father   . Hypertension Father    Social History  Substance Use Topics  . Smoking status: Never Smoker   . Smokeless tobacco: Never Used  . Alcohol Use: 0.0 oz/week    0 Standard drinks or equivalent per week     Comment: occ    Review of Systems  All other systems reviewed and are negative.     Allergies  Review of patient's allergies indicates no known allergies.  Home Medications   Prior to Admission medications   Medication Sig Start Date End Date Taking? Authorizing Provider  acetaminophen (TYLENOL) 325 MG tablet Take 650 mg by mouth every 6 (six) hours as needed.    Historical Provider, MD  amLODipine (NORVASC) 10 MG tablet Take 10 mg by mouth every morning.     Historical Provider, MD  calcium carbonate (TUMS - DOSED IN MG ELEMENTAL CALCIUM) 500 MG chewable tablet Chew 1 tablet by mouth daily.    Historical Provider, MD  Choline Fenofibrate 135 MG capsule Take 135 mg by mouth daily.    Historical Provider, MD  COCONUT OIL PO Take 1 tablet by mouth 2 (two) times daily.    Historical Provider, MD  eszopiclone (LUNESTA) 2 MG TABS tablet Take 2 mg by mouth at bedtime as needed for sleep. Take immediately before bedtime 1mg  at bedtime    Historical Provider, MD  MAGNESIUM CITRATE PO Take 1 tablet by mouth daily.    Historical Provider, MD  mesalamine (ASACOL) 400 MG EC tablet Take 400 mg by mouth 3 (three) times daily.    Historical Provider, MD  Multiple Vitamins-Minerals (MULTI COMPLETE PO) Take 1 tablet by mouth daily.    Historical Provider, MD  triamterene-hydrochlorothiazide (MAXZIDE-25) 37.5-25 MG  per tablet Take 1 tablet by mouth every morning.     Historical Provider, MD   BP 146/82 mmHg  Pulse 90  Temp(Src) 97.5 F (36.4 C) (Oral)  Resp 18  Ht 5\' 5"  (1.651 m)  Wt 74.844 kg  BMI 27.46 kg/m2  SpO2 97% Physical Exam  Constitutional: He appears well-developed and well-nourished. No distress.  HENT:  Head: Atraumatic.  Eyes: Conjunctivae are normal.  Neck: Neck supple.  Cardiovascular: Normal rate and regular rhythm.   Pulmonary/Chest: Effort normal and breath sounds normal.  Abdominal: Soft. He exhibits no distension. There is no tenderness.  No CVA tenderness  Genitourinary:  Chaperone present during exam. No inguinal lymphadenopathy or inguinal hernia noted. Normal circumcised penis free of lesion or rash. Testicles nontender on palpation, normal scrotum.  On digital rectal exam, normal rectal tone, no thrombosed hemorrhoid, normal prostate free of mass. Normal color stool.  Neurological: He is alert.  Skin: No rash noted.  Psychiatric: He has a normal mood and affect.  Nursing note and vitals reviewed.   ED Course  Procedures (including critical care time)  Patient with history of prostate cancer is complaining of urinary retention. He was able to urinate and urinalysis shows moderate leukocyte esterase hernia with 6-30 WBC. A urine culture was sent. Patient mentioned having urinary tract infection the last time that he developed something similar, therefore I will place patient on Keflex.  A bladder scan performed before patient urination shown less than 200 mL of urine.  12:06 PM Care discussed with DR. Little who agrees with plan.  Keflex for UTI, urine culture sent.  Pt does have a post void residual of >242ml.  He was able to urinate an additional 147ml afterward.  I encourage pt to f/u with Dr. Kary Kos (urologist) next week for further management.  Return precaution discussed.    Labs Review Labs Reviewed  URINALYSIS, ROUTINE W REFLEX MICROSCOPIC (NOT AT  [facility]) - Abnormal; Notable for the following:    Hgb urine dipstick SMALL (*)    Leukocytes, UA MODERATE (*)    All other components within normal limits  URINE CULTURE  URINE MICROSCOPIC-ADD ON    Imaging Review No results found. I have personally reviewed and evaluated these images and lab results as part of my medical decision-making.   EKG Interpretation None      MDM   Final diagnoses:  UTI (lower urinary tract infection)    BP 146/82 mmHg  Pulse 90  Temp(Src) 97.5 F (36.4 C) (Oral)  Resp 18  Ht 5\' 5"  (1.651 m)  Wt 74.844 kg  BMI 27.46 kg/m2  SpO2 97%     Domenic Moras, PA-C 03/06/15 1207  Domenic Moras, PA-C 03/06/15 [place], MD 03/06/15 2104

## 2015-03-06 NOTE — ED Notes (Addendum)
Provider in the room. Assisted provider with prostate exam

## 2015-03-06 NOTE — Discharge Instructions (Signed)
Take antibiotic for suspect urinary tract infection.  Follow up with your urologist for further care.  Return promptly if you are unable to urinate, develop abdominal pain or if you have other concerns.  Urinary Tract Infection A urinary tract infection (UTI) can occur any place along the urinary tract. The tract includes the kidneys, ureters, bladder, and urethra. A type of germ called bacteria often causes a UTI. UTIs are often helped with antibiotic medicine.  HOME CARE   If given, take antibiotics as told by your doctor. Finish them even if you start to feel better.  Drink enough fluids to keep your pee (urine) clear or pale yellow.  Avoid tea, drinks with caffeine, and bubbly (carbonated) drinks.  Pee often. Avoid holding your pee in for a long time.  Pee before and after having sex (intercourse).  Wipe from front to back after you poop (bowel movement) if you are a woman. Use each tissue only once. GET HELP RIGHT AWAY IF:   You have back pain.  You have lower belly (abdominal) pain.  You have chills.  You feel sick to your stomach (nauseous).  You throw up (vomit).  Your burning or discomfort with peeing does not go away.  You have a fever.  Your symptoms are not better in 3 days. MAKE SURE YOU:   Understand these instructions.  Will watch your condition.  Will get help right away if you are not doing well or get worse.   This information is not intended to replace advice given to you by your health care provider. Make sure you discuss any questions you have with your health care provider.   Document Released: 09/13/2007 Document Revised: 04/17/2014 Document Reviewed: 10/26/2011 Elsevier Interactive Patient Education [employer].

## 2015-03-06 NOTE — ED Notes (Signed)
Output 200 yellow

## 2015-03-06 NOTE — ED Notes (Addendum)
Provided beverage water for pt. Informed pt we want a urine sample.

## 2015-03-09 LAB — URINE CULTURE: Culture: >=100000

## 2015-03-10 ENCOUNTER — Telehealth (HOSPITAL_BASED_OUTPATIENT_CLINIC_OR_DEPARTMENT_OTHER): Payer: Self-pay | Admitting: Emergency Medicine

## 2015-03-10 NOTE — Telephone Encounter (Signed)
Post ED Visit - Positive Culture Follow-up: Successful Patient Follow-Up  Culture assessed and recommendations reviewed by: []  Elenor Quinones, Pharm.D. []  Heide Guile, Pharm.D., BCPS []  Parks Neptune, Pharm.D. []  Alycia Rossetti, Pharm.D., BCPS []  [place], [place].D., BCPS, AAHIVP []  Legrand Como, Pharm.D., BCPS, AAHIVP []  Milus Glazier, Pharm.D. [x]  Stephens November, Pharm.D.  Positive urine culture Enterococcus  []  Patient discharged without antimicrobial prescription and treatment is now indicated [x]  Organism is resistant to prescribed ED discharge antimicrobial []  Patient with positive blood cultures  Changes discussed with ED provider: Margarita Mail PA New antibiotic prescription  Start amoxicillin 500mg  po tid x 7days  attempting to reach patient    Hazle Nordmann 03/10/2015, 1:55 PM

## 2015-03-10 NOTE — Progress Notes (Signed)
ED Antimicrobial Stewardship Positive Culture Follow Up   AYDIAN EAVEY is an 69 y.o. male who presented to [facility] on 03/06/2015 with a chief complaint of  Chief Complaint  Patient presents with  . Urinary Retention    Recent Results (from the past 720 hour(s))  Urine culture     Status: None   Collection Time: 03/06/15 10:34 AM  Result Value Ref Range Status   Specimen Description URINE, CLEAN CATCH  Final   Special Requests NONE  Final   Culture   Final    >=100,000 COLONIES/mL ENTEROCOCCUS SPECIES Performed at [facility]    Report Status 03/09/2015 FINAL  Final   Organism ID, Bacteria ENTEROCOCCUS SPECIES  Final      Susceptibility   Enterococcus species - MIC*    AMPICILLIN <=2 SENSITIVE Sensitive     LEVOFLOXACIN 1 SENSITIVE Sensitive     NITROFURANTOIN <=16 SENSITIVE Sensitive     VANCOMYCIN 2 SENSITIVE Sensitive     * >=100,000 COLONIES/mL ENTEROCOCCUS SPECIES    Organism not covered by prescribed antibiotic (cephalexin).  New Rx: Amoxicillin 500mg  TID x7 days  ED Provider: Margarita Mail, PA-C   Judieth Keens, PharmD 03/10/2015, 10:27 AM PGY1 Resident Phone# [phone]

## 2015-03-11 ENCOUNTER — Telehealth (HOSPITAL_BASED_OUTPATIENT_CLINIC_OR_DEPARTMENT_OTHER): Payer: Self-pay | Admitting: Emergency Medicine

## 2015-03-11 NOTE — Telephone Encounter (Signed)
Post ED Visit - Positive Culture Follow-up: Successful Patient Follow-Up  Culture assessed and recommendations reviewed by: []  Elenor Quinones, Pharm.D. []  Heide Guile, Pharm.D., BCPS [x]  Parks Neptune, Pharm.D. []  Alycia Rossetti, Pharm.D., BCPS []  [place], [place].D., BCPS, AAHIVP []  Legrand Como, Pharm.D., BCPS, AAHIVP []  Milus Glazier, Pharm.D. []  Stephens November, Pharm.D.  Positive urine culture Enterococcus  []  Patient discharged without antimicrobial prescription and treatment is now indicated [x]  Organism is resistant to prescribed ED discharge antimicrobial []  Patient with positive blood cultures  Changes discussed with ED provider: Margarita Mail PA New antibiotic prescription d/c cephalexin, start amoxicillin 500mg  po tid x 7 days Called to [facility] [phone]  Contacted patient, 03/11/15 1244   Benjamin Massey 03/11/2015, 12:42 PM

## 2015-04-16 NOTE — Telephone Encounter (Deleted)
No note

## 2015-11-09 NOTE — Telephone Encounter (Signed)
xxx

## 2016-08-25 ENCOUNTER — Other Ambulatory Visit: Payer: Self-pay | Admitting: Family Medicine

## 2016-08-25 ENCOUNTER — Ambulatory Visit
Admission: RE | Admit: 2016-08-25 | Discharge: 2016-08-25 | Disposition: A | Discharge status: Home / Self Care | Payer: BLUE CROSS/BLUE SHIELD | Source: Ambulatory Visit | Attending: Family Medicine | Admitting: Family Medicine

## 2016-08-25 ENCOUNTER — Other Ambulatory Visit: Payer: BLUE CROSS/BLUE SHIELD

## 2016-08-25 DIAGNOSIS — S99912A Unspecified injury of left ankle, initial encounter: Secondary | ICD-10-CM

## 2017-02-05 DIAGNOSIS — E785 Hyperlipidemia, unspecified: Secondary | ICD-10-CM | POA: Diagnosis not present

## 2017-02-05 DIAGNOSIS — Z23 Encounter for immunization: Secondary | ICD-10-CM | POA: Diagnosis not present

## 2017-02-05 DIAGNOSIS — Z0000 Encounter for general adult medical examination without abnormal findings: Secondary | ICD-10-CM | POA: Diagnosis not present

## 2017-02-05 DIAGNOSIS — R7303 Prediabetes: Secondary | ICD-10-CM | POA: Diagnosis not present

## 2017-02-05 DIAGNOSIS — Z5181 Encounter for therapeutic drug level monitoring: Secondary | ICD-10-CM | POA: Diagnosis not present

## 2017-02-26 DIAGNOSIS — C61 Malignant neoplasm of prostate: Secondary | ICD-10-CM | POA: Diagnosis not present

## 2017-02-26 DIAGNOSIS — N411 Chronic prostatitis: Secondary | ICD-10-CM | POA: Diagnosis not present

## 2017-02-26 DIAGNOSIS — N410 Acute prostatitis: Secondary | ICD-10-CM | POA: Diagnosis not present

## 2017-03-13 DIAGNOSIS — H35033 Hypertensive retinopathy, bilateral: Secondary | ICD-10-CM | POA: Diagnosis not present

## 2017-06-01 DIAGNOSIS — K5130 Ulcerative (chronic) rectosigmoiditis without complications: Secondary | ICD-10-CM | POA: Diagnosis not present

## 2017-08-21 DIAGNOSIS — N5201 Erectile dysfunction due to arterial insufficiency: Secondary | ICD-10-CM | POA: Diagnosis not present

## 2017-08-21 DIAGNOSIS — C61 Malignant neoplasm of prostate: Secondary | ICD-10-CM | POA: Diagnosis not present

## 2017-08-23 DIAGNOSIS — R609 Edema, unspecified: Secondary | ICD-10-CM | POA: Diagnosis not present

## 2017-08-23 DIAGNOSIS — Z86711 Personal history of pulmonary embolism: Secondary | ICD-10-CM | POA: Diagnosis not present

## 2017-08-23 DIAGNOSIS — R7309 Other abnormal glucose: Secondary | ICD-10-CM | POA: Diagnosis not present

## 2017-08-23 DIAGNOSIS — M7989 Other specified soft tissue disorders: Secondary | ICD-10-CM | POA: Diagnosis not present

## 2017-08-23 DIAGNOSIS — M79672 Pain in left foot: Secondary | ICD-10-CM | POA: Diagnosis not present

## 2017-08-24 DIAGNOSIS — M7989 Other specified soft tissue disorders: Secondary | ICD-10-CM | POA: Diagnosis not present

## 2017-08-29 DIAGNOSIS — M10172 Lead-induced gout, left ankle and foot: Secondary | ICD-10-CM | POA: Diagnosis not present

## 2017-10-09 DIAGNOSIS — K5130 Ulcerative (chronic) rectosigmoiditis without complications: Secondary | ICD-10-CM | POA: Diagnosis not present

## 2018-02-19 DIAGNOSIS — C61 Malignant neoplasm of prostate: Secondary | ICD-10-CM | POA: Diagnosis not present

## 2018-02-26 DIAGNOSIS — C61 Malignant neoplasm of prostate: Secondary | ICD-10-CM | POA: Diagnosis not present

## 2018-03-01 DIAGNOSIS — E785 Hyperlipidemia, unspecified: Secondary | ICD-10-CM | POA: Diagnosis not present

## 2018-03-01 DIAGNOSIS — Z23 Encounter for immunization: Secondary | ICD-10-CM | POA: Diagnosis not present

## 2018-03-01 DIAGNOSIS — R7303 Prediabetes: Secondary | ICD-10-CM | POA: Diagnosis not present

## 2018-03-01 DIAGNOSIS — Z79899 Other long term (current) drug therapy: Secondary | ICD-10-CM | POA: Diagnosis not present

## 2018-03-01 DIAGNOSIS — Z0000 Encounter for general adult medical examination without abnormal findings: Secondary | ICD-10-CM | POA: Diagnosis not present

## 2018-03-19 DIAGNOSIS — H35033 Hypertensive retinopathy, bilateral: Secondary | ICD-10-CM | POA: Diagnosis not present

## 2018-06-04 DIAGNOSIS — D124 Benign neoplasm of descending colon: Secondary | ICD-10-CM | POA: Diagnosis not present

## 2018-06-04 DIAGNOSIS — K6389 Other specified diseases of intestine: Secondary | ICD-10-CM | POA: Diagnosis not present

## 2018-06-04 DIAGNOSIS — K626 Ulcer of anus and rectum: Secondary | ICD-10-CM | POA: Diagnosis not present

## 2018-06-04 DIAGNOSIS — K5730 Diverticulosis of large intestine without perforation or abscess without bleeding: Secondary | ICD-10-CM | POA: Diagnosis not present

## 2018-06-04 DIAGNOSIS — D122 Benign neoplasm of ascending colon: Secondary | ICD-10-CM | POA: Diagnosis not present

## 2018-06-04 DIAGNOSIS — K5130 Ulcerative (chronic) rectosigmoiditis without complications: Secondary | ICD-10-CM | POA: Diagnosis not present

## 2018-06-04 DIAGNOSIS — K5289 Other specified noninfective gastroenteritis and colitis: Secondary | ICD-10-CM | POA: Diagnosis not present

## 2018-06-04 DIAGNOSIS — K6289 Other specified diseases of anus and rectum: Secondary | ICD-10-CM | POA: Diagnosis not present

## 2018-06-04 DIAGNOSIS — Z86010 Personal history of colonic polyps: Secondary | ICD-10-CM | POA: Diagnosis not present

## 2018-06-07 DIAGNOSIS — K5289 Other specified noninfective gastroenteritis and colitis: Secondary | ICD-10-CM | POA: Diagnosis not present

## 2018-06-07 DIAGNOSIS — D122 Benign neoplasm of ascending colon: Secondary | ICD-10-CM | POA: Diagnosis not present

## 2018-06-07 DIAGNOSIS — D124 Benign neoplasm of descending colon: Secondary | ICD-10-CM | POA: Diagnosis not present

## 2018-06-13 ENCOUNTER — Other Ambulatory Visit: Payer: Self-pay | Admitting: Podiatry

## 2018-06-13 ENCOUNTER — Ambulatory Visit: Payer: BLUE CROSS/BLUE SHIELD | Admitting: Podiatry

## 2018-06-13 ENCOUNTER — Encounter: Payer: Self-pay | Admitting: Podiatry

## 2018-06-13 ENCOUNTER — Ambulatory Visit (INDEPENDENT_AMBULATORY_CARE_PROVIDER_SITE_OTHER): Payer: BLUE CROSS/BLUE SHIELD

## 2018-06-13 VITALS — BP 143/78 | HR 66 | Resp 16

## 2018-06-13 DIAGNOSIS — R600 Localized edema: Secondary | ICD-10-CM

## 2018-06-13 DIAGNOSIS — M779 Enthesopathy, unspecified: Secondary | ICD-10-CM

## 2018-06-13 DIAGNOSIS — M79671 Pain in right foot: Secondary | ICD-10-CM

## 2018-06-13 MED ORDER — DICLOFENAC SODIUM 75 MG PO TBEC: 75.0000 mg | DELAYED_RELEASE_TABLET | Freq: Two times a day (BID) | ORAL | 2 refills | Status: DC

## 2018-06-13 NOTE — Progress Notes (Signed)
   Subjective:    Patient ID: Benjamin Massey, male    DOB: 03-Nov-1945, 73 y.o.   MRN: [id number]  HPI    Review of Systems  All other systems reviewed and are negative.      Objective:   Physical Exam        Assessment & Plan:

## 2018-06-16 NOTE — Progress Notes (Signed)
Subjective:   Patient ID: Benjamin Massey, male   DOB: 73 y.o.   MRN: [id number]   HPI Patient presents stating that he has had discomfort in the outside of his right foot at times in the top of his right foot and it for the most part is reduced now but it is left him with quite a bit of swelling.  Patient does not smoke and likes to be active but has had swelling in his ankles for a long time   Review of Systems  All other systems reviewed and are negative.       Objective:  Physical Exam Vitals signs and nursing note reviewed.  Constitutional:      Appearance: He is well-developed.  Pulmonary:     Effort: Pulmonary effort is normal.  Musculoskeletal: Normal range of motion.  Skin:    General: Skin is warm.  Neurological:     Mental Status: He is alert.     Neurovascular status found to be intact muscle strength was adequate negative Homans sign was noted.  Patient has good range of motion but some restriction subtalar joint bilateral which is probably due to the swelling mechanism and mild discomfort outside right foot dorsal right foot but no area of intense discomfort currently with +1 pitting edema noted     Assessment:  Probability for chronic swelling around the ankle which does not appear to be due to any acute condition but more due to probable venous disease with tendinitis also noted     Plan:  H&P x-rays and conditions reviewed.  At this point I recommended good compression I placed him in compression stockings bilateral and elevation as he can have and he will be seen back and may require more aggressive treatment if symptoms progress or get worse  X-rays are negative for signs of fracture or advanced osteoporosis with mild spur formation noted

## 2018-07-11 ENCOUNTER — Ambulatory Visit: Payer: BLUE CROSS/BLUE SHIELD | Admitting: Podiatry

## 2018-08-19 ENCOUNTER — Other Ambulatory Visit: Payer: Self-pay | Admitting: Podiatry

## 2018-08-20 DIAGNOSIS — C61 Malignant neoplasm of prostate: Secondary | ICD-10-CM | POA: Diagnosis not present

## 2018-08-27 DIAGNOSIS — C61 Malignant neoplasm of prostate: Secondary | ICD-10-CM | POA: Diagnosis not present

## 2018-08-27 DIAGNOSIS — N5201 Erectile dysfunction due to arterial insufficiency: Secondary | ICD-10-CM | POA: Diagnosis not present

## 2018-09-09 ENCOUNTER — Ambulatory Visit: Payer: BLUE CROSS/BLUE SHIELD | Admitting: Podiatry

## 2019-01-03 DIAGNOSIS — Z23 Encounter for immunization: Secondary | ICD-10-CM | POA: Diagnosis not present

## 2019-03-03 DIAGNOSIS — C61 Malignant neoplasm of prostate: Secondary | ICD-10-CM | POA: Diagnosis not present

## 2019-03-21 DIAGNOSIS — R7303 Prediabetes: Secondary | ICD-10-CM | POA: Diagnosis not present

## 2019-03-21 DIAGNOSIS — Z5181 Encounter for therapeutic drug level monitoring: Secondary | ICD-10-CM | POA: Diagnosis not present

## 2019-03-21 DIAGNOSIS — E785 Hyperlipidemia, unspecified: Secondary | ICD-10-CM | POA: Diagnosis not present

## 2019-03-21 DIAGNOSIS — Z0000 Encounter for general adult medical examination without abnormal findings: Secondary | ICD-10-CM | POA: Diagnosis not present

## 2019-03-24 DIAGNOSIS — K5130 Ulcerative (chronic) rectosigmoiditis without complications: Secondary | ICD-10-CM | POA: Diagnosis not present

## 2019-04-29 DIAGNOSIS — H5213 Myopia, bilateral: Secondary | ICD-10-CM | POA: Diagnosis not present

## 2019-05-05 ENCOUNTER — Ambulatory Visit: Payer: PPO | Attending: Internal Medicine

## 2019-05-05 DIAGNOSIS — Z23 Encounter for immunization: Secondary | ICD-10-CM | POA: Insufficient documentation

## 2019-05-05 NOTE — Progress Notes (Signed)
   Covid-19 Vaccination Clinic  Name:  Benjamin Massey    MRN: [id number] DOB: January 27, 1946  05/05/2019  [name] was observed post Covid-19 immunization for 15 minutes without incidence. He was provided with Vaccine Information Sheet and instruction to access the V-Safe system.   [name] was instructed to call 911 with any severe reactions post vaccine: Marland Kitchen Difficulty breathing  . Swelling of your face and throat  . A fast heartbeat  . A bad rash all over your body  . Dizziness and weakness    Immunizations Administered    Name Date Dose VIS Date Route   Pfizer COVID-19 Vaccine 05/05/2019 10:53 AM 0.3 mL 03/21/2019 Intramuscular   Manufacturer: [place]   Lot: [id number]   [place]: [id number]

## 2019-05-26 ENCOUNTER — Ambulatory Visit: Payer: PPO | Attending: Internal Medicine

## 2019-05-26 DIAGNOSIS — Z23 Encounter for immunization: Secondary | ICD-10-CM | POA: Insufficient documentation

## 2019-05-26 NOTE — Progress Notes (Signed)
   Covid-19 Vaccination Clinic  Name:  Benjamin Massey    MRN: [id number] DOB: [birth year]  05/26/2019  Benjamin Massey was observed post Covid-19 immunization for 15 minutes without incidence. He was provided with Vaccine Information Sheet and instruction to access the V-Safe system.   Benjamin Massey was instructed to call 911 with any severe reactions post vaccine: Marland Kitchen Difficulty breathing  . Swelling of your face and throat  . A fast heartbeat  . A bad rash all over your body  . Dizziness and weakness    Immunizations Administered    Name Date Dose VIS Date Route   Pfizer COVID-19 Vaccine 05/26/2019 11:04 AM 0.3 mL 03/21/2019 Intramuscular   Manufacturer: [place]   Lot: [id number]   [place]: [id number]

## 2019-07-09 DIAGNOSIS — W19XXXA Unspecified fall, initial encounter: Secondary | ICD-10-CM | POA: Diagnosis not present

## 2019-07-09 DIAGNOSIS — L988 Other specified disorders of the skin and subcutaneous tissue: Secondary | ICD-10-CM | POA: Diagnosis not present

## 2019-07-18 DIAGNOSIS — S90819A Abrasion, unspecified foot, initial encounter: Secondary | ICD-10-CM | POA: Diagnosis not present

## 2019-07-29 DIAGNOSIS — M5416 Radiculopathy, lumbar region: Secondary | ICD-10-CM | POA: Diagnosis not present

## 2019-08-06 DIAGNOSIS — M1612 Unilateral primary osteoarthritis, left hip: Secondary | ICD-10-CM | POA: Diagnosis not present

## 2019-08-25 DIAGNOSIS — C61 Malignant neoplasm of prostate: Secondary | ICD-10-CM | POA: Diagnosis not present

## 2019-08-27 DIAGNOSIS — M1612 Unilateral primary osteoarthritis, left hip: Secondary | ICD-10-CM | POA: Diagnosis not present

## 2019-08-27 DIAGNOSIS — M1712 Unilateral primary osteoarthritis, left knee: Secondary | ICD-10-CM | POA: Diagnosis not present

## 2019-09-01 DIAGNOSIS — C61 Malignant neoplasm of prostate: Secondary | ICD-10-CM | POA: Diagnosis not present

## 2019-09-24 DIAGNOSIS — M25552 Pain in left hip: Secondary | ICD-10-CM | POA: Diagnosis not present

## 2019-09-25 DIAGNOSIS — M1612 Unilateral primary osteoarthritis, left hip: Secondary | ICD-10-CM | POA: Diagnosis not present

## 2019-12-03 DIAGNOSIS — M1612 Unilateral primary osteoarthritis, left hip: Secondary | ICD-10-CM | POA: Diagnosis not present

## 2019-12-09 DIAGNOSIS — M62838 Other muscle spasm: Secondary | ICD-10-CM | POA: Diagnosis not present

## 2019-12-09 DIAGNOSIS — M1612 Unilateral primary osteoarthritis, left hip: Secondary | ICD-10-CM | POA: Diagnosis not present

## 2019-12-09 DIAGNOSIS — M1611 Unilateral primary osteoarthritis, right hip: Secondary | ICD-10-CM | POA: Diagnosis not present

## 2019-12-11 DIAGNOSIS — M62838 Other muscle spasm: Secondary | ICD-10-CM | POA: Diagnosis not present

## 2019-12-11 DIAGNOSIS — M1611 Unilateral primary osteoarthritis, right hip: Secondary | ICD-10-CM | POA: Diagnosis not present

## 2019-12-11 DIAGNOSIS — M1612 Unilateral primary osteoarthritis, left hip: Secondary | ICD-10-CM | POA: Diagnosis not present

## 2019-12-17 DIAGNOSIS — M1611 Unilateral primary osteoarthritis, right hip: Secondary | ICD-10-CM | POA: Diagnosis not present

## 2019-12-17 DIAGNOSIS — M62838 Other muscle spasm: Secondary | ICD-10-CM | POA: Diagnosis not present

## 2019-12-17 DIAGNOSIS — M1612 Unilateral primary osteoarthritis, left hip: Secondary | ICD-10-CM | POA: Diagnosis not present

## 2019-12-19 DIAGNOSIS — M62838 Other muscle spasm: Secondary | ICD-10-CM | POA: Diagnosis not present

## 2019-12-19 DIAGNOSIS — M1611 Unilateral primary osteoarthritis, right hip: Secondary | ICD-10-CM | POA: Diagnosis not present

## 2019-12-19 DIAGNOSIS — M1612 Unilateral primary osteoarthritis, left hip: Secondary | ICD-10-CM | POA: Diagnosis not present

## 2019-12-23 DIAGNOSIS — M62838 Other muscle spasm: Secondary | ICD-10-CM | POA: Diagnosis not present

## 2019-12-23 DIAGNOSIS — M1612 Unilateral primary osteoarthritis, left hip: Secondary | ICD-10-CM | POA: Diagnosis not present

## 2019-12-23 DIAGNOSIS — M1611 Unilateral primary osteoarthritis, right hip: Secondary | ICD-10-CM | POA: Diagnosis not present

## 2019-12-25 DIAGNOSIS — M1611 Unilateral primary osteoarthritis, right hip: Secondary | ICD-10-CM | POA: Diagnosis not present

## 2019-12-25 DIAGNOSIS — M62838 Other muscle spasm: Secondary | ICD-10-CM | POA: Diagnosis not present

## 2019-12-25 DIAGNOSIS — M1612 Unilateral primary osteoarthritis, left hip: Secondary | ICD-10-CM | POA: Diagnosis not present

## 2019-12-30 DIAGNOSIS — M62838 Other muscle spasm: Secondary | ICD-10-CM | POA: Diagnosis not present

## 2019-12-30 DIAGNOSIS — M1611 Unilateral primary osteoarthritis, right hip: Secondary | ICD-10-CM | POA: Diagnosis not present

## 2019-12-30 DIAGNOSIS — M1612 Unilateral primary osteoarthritis, left hip: Secondary | ICD-10-CM | POA: Diagnosis not present

## 2020-01-01 DIAGNOSIS — M1611 Unilateral primary osteoarthritis, right hip: Secondary | ICD-10-CM | POA: Diagnosis not present

## 2020-01-01 DIAGNOSIS — M1612 Unilateral primary osteoarthritis, left hip: Secondary | ICD-10-CM | POA: Diagnosis not present

## 2020-01-01 DIAGNOSIS — M62838 Other muscle spasm: Secondary | ICD-10-CM | POA: Diagnosis not present

## 2020-01-06 DIAGNOSIS — M62838 Other muscle spasm: Secondary | ICD-10-CM | POA: Diagnosis not present

## 2020-01-06 DIAGNOSIS — M1611 Unilateral primary osteoarthritis, right hip: Secondary | ICD-10-CM | POA: Diagnosis not present

## 2020-01-06 DIAGNOSIS — M1612 Unilateral primary osteoarthritis, left hip: Secondary | ICD-10-CM | POA: Diagnosis not present

## 2020-01-08 DIAGNOSIS — M62838 Other muscle spasm: Secondary | ICD-10-CM | POA: Diagnosis not present

## 2020-01-08 DIAGNOSIS — M1612 Unilateral primary osteoarthritis, left hip: Secondary | ICD-10-CM | POA: Diagnosis not present

## 2020-01-08 DIAGNOSIS — M1611 Unilateral primary osteoarthritis, right hip: Secondary | ICD-10-CM | POA: Diagnosis not present

## 2020-01-21 DIAGNOSIS — M1612 Unilateral primary osteoarthritis, left hip: Secondary | ICD-10-CM | POA: Diagnosis not present

## 2020-01-21 DIAGNOSIS — M1611 Unilateral primary osteoarthritis, right hip: Secondary | ICD-10-CM | POA: Diagnosis not present

## 2020-01-21 DIAGNOSIS — M62838 Other muscle spasm: Secondary | ICD-10-CM | POA: Diagnosis not present

## 2020-02-04 DIAGNOSIS — M1612 Unilateral primary osteoarthritis, left hip: Secondary | ICD-10-CM | POA: Diagnosis not present

## 2020-02-04 DIAGNOSIS — M62838 Other muscle spasm: Secondary | ICD-10-CM | POA: Diagnosis not present

## 2020-02-04 DIAGNOSIS — M1611 Unilateral primary osteoarthritis, right hip: Secondary | ICD-10-CM | POA: Diagnosis not present

## 2020-02-18 DIAGNOSIS — M1611 Unilateral primary osteoarthritis, right hip: Secondary | ICD-10-CM | POA: Diagnosis not present

## 2020-02-18 DIAGNOSIS — M62838 Other muscle spasm: Secondary | ICD-10-CM | POA: Diagnosis not present

## 2020-02-18 DIAGNOSIS — M1612 Unilateral primary osteoarthritis, left hip: Secondary | ICD-10-CM | POA: Diagnosis not present

## 2020-03-24 ENCOUNTER — Other Ambulatory Visit: Payer: Self-pay | Admitting: Urology

## 2020-03-24 DIAGNOSIS — C61 Malignant neoplasm of prostate: Secondary | ICD-10-CM

## 2020-03-25 DIAGNOSIS — N400 Enlarged prostate without lower urinary tract symptoms: Secondary | ICD-10-CM | POA: Diagnosis not present

## 2020-03-25 DIAGNOSIS — R7303 Prediabetes: Secondary | ICD-10-CM | POA: Diagnosis not present

## 2020-03-25 DIAGNOSIS — Z0000 Encounter for general adult medical examination without abnormal findings: Secondary | ICD-10-CM | POA: Diagnosis not present

## 2020-03-25 DIAGNOSIS — E785 Hyperlipidemia, unspecified: Secondary | ICD-10-CM | POA: Diagnosis not present

## 2020-03-25 DIAGNOSIS — K5130 Ulcerative (chronic) rectosigmoiditis without complications: Secondary | ICD-10-CM | POA: Diagnosis not present

## 2020-03-25 DIAGNOSIS — Z5181 Encounter for therapeutic drug level monitoring: Secondary | ICD-10-CM | POA: Diagnosis not present

## 2020-03-30 DIAGNOSIS — C61 Malignant neoplasm of prostate: Secondary | ICD-10-CM | POA: Diagnosis not present

## 2020-03-30 DIAGNOSIS — R311 Benign essential microscopic hematuria: Secondary | ICD-10-CM | POA: Diagnosis not present

## 2020-04-01 DIAGNOSIS — Z20822 Contact with and (suspected) exposure to covid-19: Secondary | ICD-10-CM | POA: Diagnosis not present

## 2020-04-16 DIAGNOSIS — R311 Benign essential microscopic hematuria: Secondary | ICD-10-CM | POA: Diagnosis not present

## 2020-04-20 DIAGNOSIS — K429 Umbilical hernia without obstruction or gangrene: Secondary | ICD-10-CM | POA: Diagnosis not present

## 2020-04-20 DIAGNOSIS — K5730 Diverticulosis of large intestine without perforation or abscess without bleeding: Secondary | ICD-10-CM | POA: Diagnosis not present

## 2020-04-20 DIAGNOSIS — R3129 Other microscopic hematuria: Secondary | ICD-10-CM | POA: Diagnosis not present

## 2020-04-20 DIAGNOSIS — K7689 Other specified diseases of liver: Secondary | ICD-10-CM | POA: Diagnosis not present

## 2020-04-20 DIAGNOSIS — R311 Benign essential microscopic hematuria: Secondary | ICD-10-CM | POA: Diagnosis not present

## 2020-04-21 ENCOUNTER — Other Ambulatory Visit: Payer: PPO

## 2020-04-23 ENCOUNTER — Other Ambulatory Visit: Payer: Self-pay

## 2020-04-23 ENCOUNTER — Ambulatory Visit
Admission: RE | Admit: 2020-04-23 | Discharge: 2020-04-23 | Disposition: A | Discharge status: Home / Self Care | Payer: PPO | Source: Ambulatory Visit | Attending: Urology | Admitting: Urology

## 2020-04-23 DIAGNOSIS — C61 Malignant neoplasm of prostate: Secondary | ICD-10-CM

## 2020-04-23 DIAGNOSIS — M87852 Other osteonecrosis, left femur: Secondary | ICD-10-CM | POA: Diagnosis not present

## 2020-04-23 DIAGNOSIS — K4090 Unilateral inguinal hernia, without obstruction or gangrene, not specified as recurrent: Secondary | ICD-10-CM | POA: Diagnosis not present

## 2020-04-23 MED ORDER — GADOBENATE DIMEGLUMINE 529 MG/ML IV SOLN
15.0000 mL | Freq: Once | INTRAVENOUS | Status: AC | PRN
  Administered 2020-04-23: 15 mL via INTRAVENOUS

## 2020-05-03 DIAGNOSIS — H524 Presbyopia: Secondary | ICD-10-CM | POA: Diagnosis not present

## 2020-05-03 DIAGNOSIS — H5213 Myopia, bilateral: Secondary | ICD-10-CM | POA: Diagnosis not present

## 2020-05-03 DIAGNOSIS — H52223 Regular astigmatism, bilateral: Secondary | ICD-10-CM | POA: Diagnosis not present

## 2020-05-11 DIAGNOSIS — C61 Malignant neoplasm of prostate: Secondary | ICD-10-CM | POA: Diagnosis not present

## 2020-05-11 DIAGNOSIS — R311 Benign essential microscopic hematuria: Secondary | ICD-10-CM | POA: Diagnosis not present

## 2020-05-11 DIAGNOSIS — N529 Male erectile dysfunction, unspecified: Secondary | ICD-10-CM | POA: Diagnosis not present

## 2020-07-09 DIAGNOSIS — K5130 Ulcerative (chronic) rectosigmoiditis without complications: Secondary | ICD-10-CM | POA: Diagnosis not present

## 2020-09-17 DIAGNOSIS — D124 Benign neoplasm of descending colon: Secondary | ICD-10-CM | POA: Diagnosis not present

## 2020-09-17 DIAGNOSIS — K6389 Other specified diseases of intestine: Secondary | ICD-10-CM | POA: Diagnosis not present

## 2020-09-17 DIAGNOSIS — K5140 Inflammatory polyps of colon without complications: Secondary | ICD-10-CM | POA: Diagnosis not present

## 2020-09-17 DIAGNOSIS — K5730 Diverticulosis of large intestine without perforation or abscess without bleeding: Secondary | ICD-10-CM | POA: Diagnosis not present

## 2020-09-17 DIAGNOSIS — K5289 Other specified noninfective gastroenteritis and colitis: Secondary | ICD-10-CM | POA: Diagnosis not present

## 2020-09-17 DIAGNOSIS — K5130 Ulcerative (chronic) rectosigmoiditis without complications: Secondary | ICD-10-CM | POA: Diagnosis not present

## 2020-09-17 DIAGNOSIS — Z86010 Personal history of colonic polyps: Secondary | ICD-10-CM | POA: Diagnosis not present

## 2020-09-17 DIAGNOSIS — K640 First degree hemorrhoids: Secondary | ICD-10-CM | POA: Diagnosis not present

## 2020-09-21 DIAGNOSIS — K5140 Inflammatory polyps of colon without complications: Secondary | ICD-10-CM | POA: Diagnosis not present

## 2020-09-21 DIAGNOSIS — D124 Benign neoplasm of descending colon: Secondary | ICD-10-CM | POA: Diagnosis not present

## 2020-09-21 DIAGNOSIS — K5289 Other specified noninfective gastroenteritis and colitis: Secondary | ICD-10-CM | POA: Diagnosis not present

## 2020-11-08 DIAGNOSIS — Z8546 Personal history of malignant neoplasm of prostate: Secondary | ICD-10-CM | POA: Diagnosis not present

## 2020-12-14 DIAGNOSIS — I10 Essential (primary) hypertension: Secondary | ICD-10-CM | POA: Diagnosis not present

## 2020-12-14 DIAGNOSIS — G510 Bell's palsy: Secondary | ICD-10-CM | POA: Diagnosis not present

## 2021-02-22 IMAGING — MR MR PROSTATE WO/W CM
12 series · 48 of 48 positions shown · IV contrast (multihance)
Comparison: None.

CLINICAL DATA: Prostate carcinoma, Gleason score 3+3=6. Active
surveillance.

EXAM:
MR PROSTATE WITHOUT AND WITH CONTRAST
TECHNIQUE: Multiplanar multisequence MRI images were obtained of the pelvis
centered about the prostate. Pre and post contrast images were
obtained.
CONTRAST:  15mL MULTIHANCE GADOBENATE DIMEGLUMINE 529 MG/ML IV SOLN

[Series 3: T2 · coronal · 3.0mm · 0.56mm/px · 1 of 23 slices shown (1 of 3)]
[im 1/23]
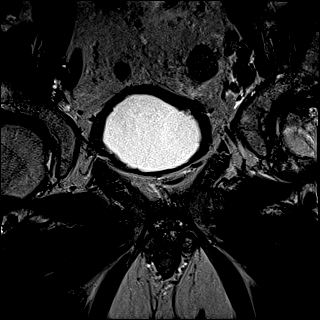

[Series 4: T1 · axial · 5.0mm · 1.25mm/px · z∈[-37,+198]mm · 2 of 96 slices shown]
[im 1/96]
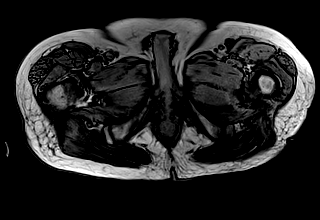
[im 96/96]
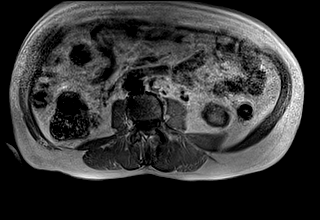

[Series 5: DWI · axial · 3.0mm · 1.75mm/px · z∈[-48,+21]mm · 2 of 72 slices shown (1 of 3)]
[im 1/72]
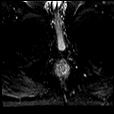
[im 72/72]
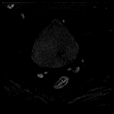

[Series 6: DWI · axial · 3.0mm · 1.75mm/px · 1 of 24 slices shown (2 of 3)]
[im 1/24]
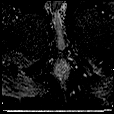

[Series 7: DWI · axial · 3.0mm · 1.75mm/px · 1 of 24 slices shown (3 of 3)]
[im 1/24]
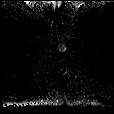

[Series 8: T2 · axial · 3.0mm · 0.70mm/px · 1 of 23 slices shown (2 of 3)]
[im 1/23]
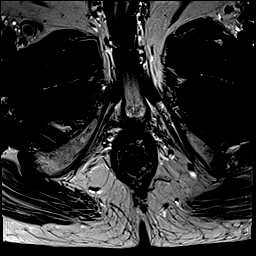

[Series 9: T2 · axial · 1.0mm · 1.04mm/px · z∈[-52,+27]mm · 2 of 80 slices shown (3 of 3)]
[im 1/80]
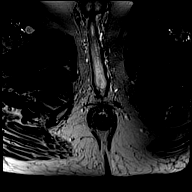
[im 80/80]
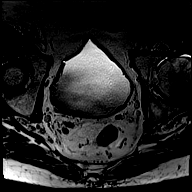

[Series 10: pre t1_twist_tra_dyn · axial · non-contrast · 3.5mm · 0.83mm/px · 1 of 20 slices shown]
[im 1/20]
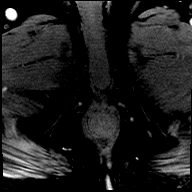

[Series 11: post t1_twist_tra_dyn-copy center · axial · non-contrast · 3.5mm · 0.83mm/px · z∈[-46,+21]mm · 17 of 600 slices shown]
[im 1/600]
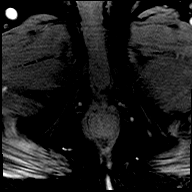
[im 38/600]
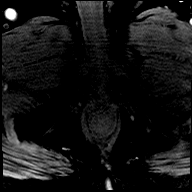
[im 75/600]
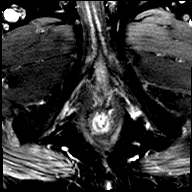
[im 113/600]
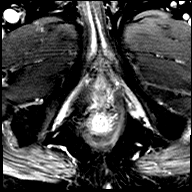
[im 150/600]
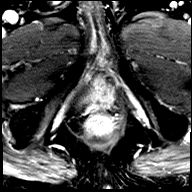
[im 188/600]
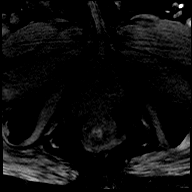
[im 225/600]
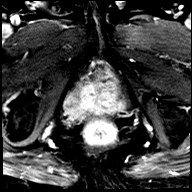
[im 263/600]
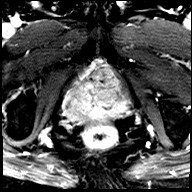
[im 300/600]
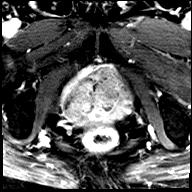
[im 337/600]
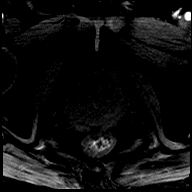
[im 375/600]
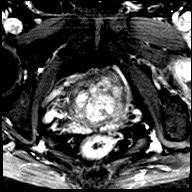
[im 412/600]
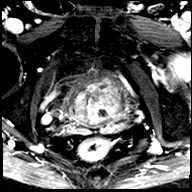
[im 450/600]
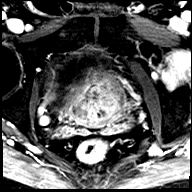
[im 487/600]
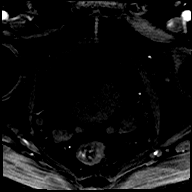
[im 525/600]
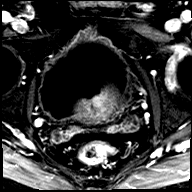
[im 562/600]
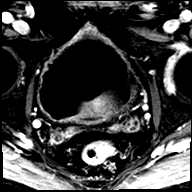
[im 600/600]
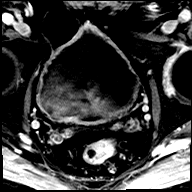

[Series 12: post t1_twist_tra_dyn-copy cent_sub · axial · 3.5mm · 0.83mm/px · z∈[-46,+21]mm · 16 of 575 slices shown]
[im 1/575]
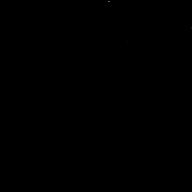
[im 39/575]
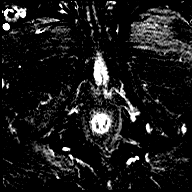
[im 77/575]
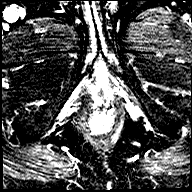
[im 115/575]
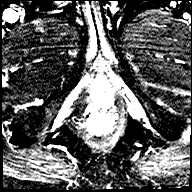
[im 154/575]
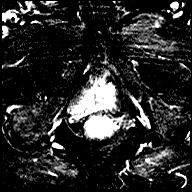
[im 192/575]
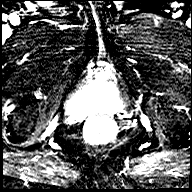
[im 230/575]
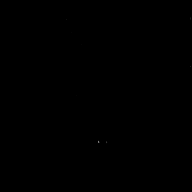
[im 268/575]
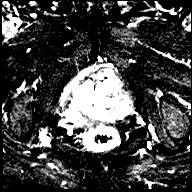
[im 307/575]
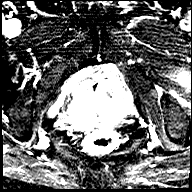
[im 345/575]
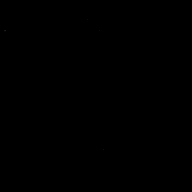
[im 383/575]
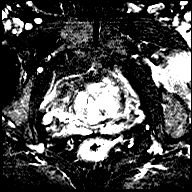
[im 421/575]
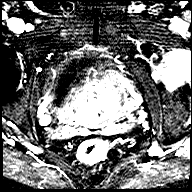
[im 460/575]
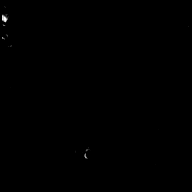
[im 498/575]
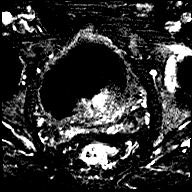
[im 536/575]
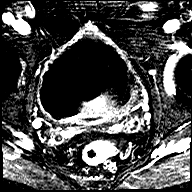
[im 575/575]
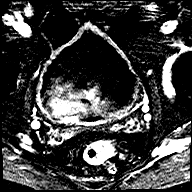

[Series 13: t1_vibe_dixon_tra_f · axial · 2.5mm · 0.91mm/px · z∈[-39,+159]mm · 2 of 80 slices shown]
[im 1/80]
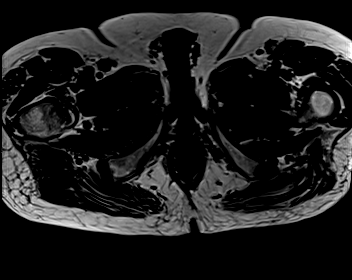
[im 80/80]
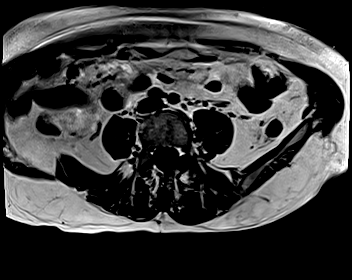

[Series 14: t1_vibe_dixon_tra_w · axial · 2.5mm · 0.91mm/px · z∈[-39,+159]mm · 2 of 80 slices shown]
[im 1/80]
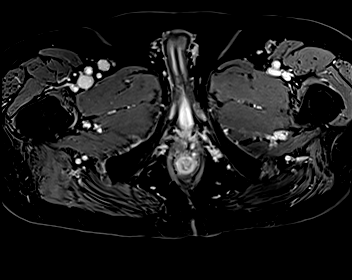
[im 80/80]
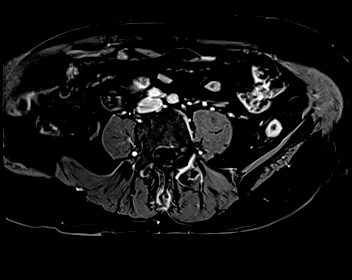

[48 of 48 positions shown; findings below may reference images not displayed]

FINDINGS: Prostate:

-- Peripheral Zone: Linear/wedge shaped hypointensities are noted
bilaterally on ADC; however, no focal ADC hypointense or high
b-value DWI hyperintense nodules are identified.

-- Transition/Central Zone: Circumscribed BPH nodules are noted, but
no suspicious nodules with obscured or non-circumscribed margins
seen.

-- Measurements/Volume:  5.3 x 4.6 x 5.7 cm (volume = 73 cm^3)

Transcapsular spread:  Absent

Seminal vesicle involvement:  Absent

Neurovascular bundle involvement:  Absent

Pelvic adenopathy: None visualized

Bone metastasis: None visualized

Other: Mild diffuse bladder wall thickening and trabeculation,
consistent with chronic bladder outlet obstruction. Avascular
necrosis of left femoral head is noted. Sigmoid diverticulosis is
seen, without evidence of diverticulitis. Small right inguinal
hernia is seen which contains only fat.
IMPRESSION: No radiographic evidence of high-grade prostate carcinoma. No
radiographic evidence of high-grade prostate carcinoma. PI-RADS 2:
Low (clinically significant cancer is unlikely to be present)

## 2021-04-22 DIAGNOSIS — I10 Essential (primary) hypertension: Secondary | ICD-10-CM | POA: Diagnosis not present

## 2021-04-22 DIAGNOSIS — K5130 Ulcerative (chronic) rectosigmoiditis without complications: Secondary | ICD-10-CM | POA: Diagnosis not present

## 2021-04-22 DIAGNOSIS — E785 Hyperlipidemia, unspecified: Secondary | ICD-10-CM | POA: Diagnosis not present

## 2021-04-22 DIAGNOSIS — R7303 Prediabetes: Secondary | ICD-10-CM | POA: Diagnosis not present

## 2021-04-22 DIAGNOSIS — Z0000 Encounter for general adult medical examination without abnormal findings: Secondary | ICD-10-CM | POA: Diagnosis not present

## 2021-05-31 DIAGNOSIS — H35033 Hypertensive retinopathy, bilateral: Secondary | ICD-10-CM | POA: Diagnosis not present

## 2021-05-31 DIAGNOSIS — H353230 Exudative age-related macular degeneration, bilateral, stage unspecified: Secondary | ICD-10-CM | POA: Diagnosis not present

## 2021-05-31 DIAGNOSIS — H524 Presbyopia: Secondary | ICD-10-CM | POA: Diagnosis not present

## 2021-06-15 DIAGNOSIS — I10 Essential (primary) hypertension: Secondary | ICD-10-CM | POA: Diagnosis not present

## 2021-06-15 DIAGNOSIS — Z8546 Personal history of malignant neoplasm of prostate: Secondary | ICD-10-CM | POA: Diagnosis not present

## 2021-06-15 DIAGNOSIS — E785 Hyperlipidemia, unspecified: Secondary | ICD-10-CM | POA: Diagnosis not present

## 2021-06-15 DIAGNOSIS — E663 Overweight: Secondary | ICD-10-CM | POA: Diagnosis not present

## 2021-10-21 DIAGNOSIS — R7303 Prediabetes: Secondary | ICD-10-CM | POA: Diagnosis not present

## 2021-10-21 DIAGNOSIS — I10 Essential (primary) hypertension: Secondary | ICD-10-CM | POA: Diagnosis not present

## 2022-05-01 DIAGNOSIS — R7303 Prediabetes: Secondary | ICD-10-CM | POA: Diagnosis not present

## 2022-05-01 DIAGNOSIS — K5130 Ulcerative (chronic) rectosigmoiditis without complications: Secondary | ICD-10-CM | POA: Diagnosis not present

## 2022-05-01 DIAGNOSIS — Z0000 Encounter for general adult medical examination without abnormal findings: Secondary | ICD-10-CM | POA: Diagnosis not present

## 2022-05-01 DIAGNOSIS — Z8546 Personal history of malignant neoplasm of prostate: Secondary | ICD-10-CM | POA: Diagnosis not present

## 2022-05-01 DIAGNOSIS — E785 Hyperlipidemia, unspecified: Secondary | ICD-10-CM | POA: Diagnosis not present

## 2022-05-01 DIAGNOSIS — I10 Essential (primary) hypertension: Secondary | ICD-10-CM | POA: Diagnosis not present

## 2022-05-01 DIAGNOSIS — M79605 Pain in left leg: Secondary | ICD-10-CM | POA: Diagnosis not present

## 2022-05-01 DIAGNOSIS — Z23 Encounter for immunization: Secondary | ICD-10-CM | POA: Diagnosis not present

## 2022-05-08 DIAGNOSIS — M25652 Stiffness of left hip, not elsewhere classified: Secondary | ICD-10-CM | POA: Diagnosis not present

## 2022-05-08 DIAGNOSIS — M25552 Pain in left hip: Secondary | ICD-10-CM | POA: Diagnosis not present

## 2022-05-08 DIAGNOSIS — R262 Difficulty in walking, not elsewhere classified: Secondary | ICD-10-CM | POA: Diagnosis not present

## 2022-05-10 DIAGNOSIS — R262 Difficulty in walking, not elsewhere classified: Secondary | ICD-10-CM | POA: Diagnosis not present

## 2022-05-10 DIAGNOSIS — M25552 Pain in left hip: Secondary | ICD-10-CM | POA: Diagnosis not present

## 2022-05-10 DIAGNOSIS — M25652 Stiffness of left hip, not elsewhere classified: Secondary | ICD-10-CM | POA: Diagnosis not present

## 2022-05-17 DIAGNOSIS — R262 Difficulty in walking, not elsewhere classified: Secondary | ICD-10-CM | POA: Diagnosis not present

## 2022-05-17 DIAGNOSIS — M25552 Pain in left hip: Secondary | ICD-10-CM | POA: Diagnosis not present

## 2022-05-17 DIAGNOSIS — M25652 Stiffness of left hip, not elsewhere classified: Secondary | ICD-10-CM | POA: Diagnosis not present

## 2022-05-19 DIAGNOSIS — M25552 Pain in left hip: Secondary | ICD-10-CM | POA: Diagnosis not present

## 2022-05-19 DIAGNOSIS — R262 Difficulty in walking, not elsewhere classified: Secondary | ICD-10-CM | POA: Diagnosis not present

## 2022-05-19 DIAGNOSIS — M25652 Stiffness of left hip, not elsewhere classified: Secondary | ICD-10-CM | POA: Diagnosis not present

## 2022-05-22 DIAGNOSIS — H35033 Hypertensive retinopathy, bilateral: Secondary | ICD-10-CM | POA: Diagnosis not present

## 2022-05-22 DIAGNOSIS — M25552 Pain in left hip: Secondary | ICD-10-CM | POA: Diagnosis not present

## 2022-05-22 DIAGNOSIS — M25652 Stiffness of left hip, not elsewhere classified: Secondary | ICD-10-CM | POA: Diagnosis not present

## 2022-05-22 DIAGNOSIS — R262 Difficulty in walking, not elsewhere classified: Secondary | ICD-10-CM | POA: Diagnosis not present

## 2022-05-26 DIAGNOSIS — M25552 Pain in left hip: Secondary | ICD-10-CM | POA: Diagnosis not present

## 2022-05-26 DIAGNOSIS — R262 Difficulty in walking, not elsewhere classified: Secondary | ICD-10-CM | POA: Diagnosis not present

## 2022-05-26 DIAGNOSIS — M25652 Stiffness of left hip, not elsewhere classified: Secondary | ICD-10-CM | POA: Diagnosis not present

## 2022-05-29 DIAGNOSIS — R262 Difficulty in walking, not elsewhere classified: Secondary | ICD-10-CM | POA: Diagnosis not present

## 2022-05-29 DIAGNOSIS — M25652 Stiffness of left hip, not elsewhere classified: Secondary | ICD-10-CM | POA: Diagnosis not present

## 2022-05-29 DIAGNOSIS — M25552 Pain in left hip: Secondary | ICD-10-CM | POA: Diagnosis not present

## 2022-06-02 DIAGNOSIS — R262 Difficulty in walking, not elsewhere classified: Secondary | ICD-10-CM | POA: Diagnosis not present

## 2022-06-02 DIAGNOSIS — M25652 Stiffness of left hip, not elsewhere classified: Secondary | ICD-10-CM | POA: Diagnosis not present

## 2022-06-02 DIAGNOSIS — M25552 Pain in left hip: Secondary | ICD-10-CM | POA: Diagnosis not present

## 2022-06-05 DIAGNOSIS — M25652 Stiffness of left hip, not elsewhere classified: Secondary | ICD-10-CM | POA: Diagnosis not present

## 2022-06-05 DIAGNOSIS — R262 Difficulty in walking, not elsewhere classified: Secondary | ICD-10-CM | POA: Diagnosis not present

## 2022-06-05 DIAGNOSIS — M25562 Pain in left knee: Secondary | ICD-10-CM | POA: Diagnosis not present

## 2022-06-07 DIAGNOSIS — R262 Difficulty in walking, not elsewhere classified: Secondary | ICD-10-CM | POA: Diagnosis not present

## 2022-06-07 DIAGNOSIS — M25552 Pain in left hip: Secondary | ICD-10-CM | POA: Diagnosis not present

## 2022-06-07 DIAGNOSIS — M25652 Stiffness of left hip, not elsewhere classified: Secondary | ICD-10-CM | POA: Diagnosis not present

## 2022-06-09 DIAGNOSIS — M25552 Pain in left hip: Secondary | ICD-10-CM | POA: Diagnosis not present

## 2022-06-09 DIAGNOSIS — M25652 Stiffness of left hip, not elsewhere classified: Secondary | ICD-10-CM | POA: Diagnosis not present

## 2022-06-09 DIAGNOSIS — R262 Difficulty in walking, not elsewhere classified: Secondary | ICD-10-CM | POA: Diagnosis not present

## 2022-06-14 DIAGNOSIS — R262 Difficulty in walking, not elsewhere classified: Secondary | ICD-10-CM | POA: Diagnosis not present

## 2022-06-14 DIAGNOSIS — M25652 Stiffness of left hip, not elsewhere classified: Secondary | ICD-10-CM | POA: Diagnosis not present

## 2022-06-14 DIAGNOSIS — M25552 Pain in left hip: Secondary | ICD-10-CM | POA: Diagnosis not present

## 2022-06-16 DIAGNOSIS — R262 Difficulty in walking, not elsewhere classified: Secondary | ICD-10-CM | POA: Diagnosis not present

## 2022-06-16 DIAGNOSIS — M25552 Pain in left hip: Secondary | ICD-10-CM | POA: Diagnosis not present

## 2022-06-16 DIAGNOSIS — M25652 Stiffness of left hip, not elsewhere classified: Secondary | ICD-10-CM | POA: Diagnosis not present

## 2022-06-21 DIAGNOSIS — M25552 Pain in left hip: Secondary | ICD-10-CM | POA: Diagnosis not present

## 2022-06-21 DIAGNOSIS — M25652 Stiffness of left hip, not elsewhere classified: Secondary | ICD-10-CM | POA: Diagnosis not present

## 2022-06-21 DIAGNOSIS — R262 Difficulty in walking, not elsewhere classified: Secondary | ICD-10-CM | POA: Diagnosis not present

## 2022-06-23 DIAGNOSIS — M25652 Stiffness of left hip, not elsewhere classified: Secondary | ICD-10-CM | POA: Diagnosis not present

## 2022-06-23 DIAGNOSIS — M25552 Pain in left hip: Secondary | ICD-10-CM | POA: Diagnosis not present

## 2022-06-23 DIAGNOSIS — R262 Difficulty in walking, not elsewhere classified: Secondary | ICD-10-CM | POA: Diagnosis not present

## 2022-06-26 DIAGNOSIS — M25652 Stiffness of left hip, not elsewhere classified: Secondary | ICD-10-CM | POA: Diagnosis not present

## 2022-06-26 DIAGNOSIS — R262 Difficulty in walking, not elsewhere classified: Secondary | ICD-10-CM | POA: Diagnosis not present

## 2022-06-26 DIAGNOSIS — M25552 Pain in left hip: Secondary | ICD-10-CM | POA: Diagnosis not present

## 2022-06-28 DIAGNOSIS — M25652 Stiffness of left hip, not elsewhere classified: Secondary | ICD-10-CM | POA: Diagnosis not present

## 2022-06-28 DIAGNOSIS — R262 Difficulty in walking, not elsewhere classified: Secondary | ICD-10-CM | POA: Diagnosis not present

## 2022-06-28 DIAGNOSIS — M25552 Pain in left hip: Secondary | ICD-10-CM | POA: Diagnosis not present

## 2022-07-03 DIAGNOSIS — M25552 Pain in left hip: Secondary | ICD-10-CM | POA: Diagnosis not present

## 2022-07-03 DIAGNOSIS — R262 Difficulty in walking, not elsewhere classified: Secondary | ICD-10-CM | POA: Diagnosis not present

## 2022-07-03 DIAGNOSIS — M25652 Stiffness of left hip, not elsewhere classified: Secondary | ICD-10-CM | POA: Diagnosis not present

## 2022-07-05 DIAGNOSIS — M25652 Stiffness of left hip, not elsewhere classified: Secondary | ICD-10-CM | POA: Diagnosis not present

## 2022-07-05 DIAGNOSIS — R262 Difficulty in walking, not elsewhere classified: Secondary | ICD-10-CM | POA: Diagnosis not present

## 2022-07-05 DIAGNOSIS — M25552 Pain in left hip: Secondary | ICD-10-CM | POA: Diagnosis not present

## 2022-07-19 DIAGNOSIS — R262 Difficulty in walking, not elsewhere classified: Secondary | ICD-10-CM | POA: Diagnosis not present

## 2022-07-19 DIAGNOSIS — M25652 Stiffness of left hip, not elsewhere classified: Secondary | ICD-10-CM | POA: Diagnosis not present

## 2022-07-19 DIAGNOSIS — M25552 Pain in left hip: Secondary | ICD-10-CM | POA: Diagnosis not present

## 2022-07-26 DIAGNOSIS — R262 Difficulty in walking, not elsewhere classified: Secondary | ICD-10-CM | POA: Diagnosis not present

## 2022-07-26 DIAGNOSIS — M25652 Stiffness of left hip, not elsewhere classified: Secondary | ICD-10-CM | POA: Diagnosis not present

## 2022-07-26 DIAGNOSIS — M25552 Pain in left hip: Secondary | ICD-10-CM | POA: Diagnosis not present

## 2022-08-02 DIAGNOSIS — M25652 Stiffness of left hip, not elsewhere classified: Secondary | ICD-10-CM | POA: Diagnosis not present

## 2022-08-02 DIAGNOSIS — M25552 Pain in left hip: Secondary | ICD-10-CM | POA: Diagnosis not present

## 2022-08-02 DIAGNOSIS — R262 Difficulty in walking, not elsewhere classified: Secondary | ICD-10-CM | POA: Diagnosis not present

## 2022-08-14 DIAGNOSIS — R262 Difficulty in walking, not elsewhere classified: Secondary | ICD-10-CM | POA: Diagnosis not present

## 2022-08-14 DIAGNOSIS — M25652 Stiffness of left hip, not elsewhere classified: Secondary | ICD-10-CM | POA: Diagnosis not present

## 2022-08-14 DIAGNOSIS — M25552 Pain in left hip: Secondary | ICD-10-CM | POA: Diagnosis not present

## 2022-08-30 DIAGNOSIS — M25552 Pain in left hip: Secondary | ICD-10-CM | POA: Diagnosis not present

## 2022-08-30 DIAGNOSIS — R262 Difficulty in walking, not elsewhere classified: Secondary | ICD-10-CM | POA: Diagnosis not present

## 2022-08-30 DIAGNOSIS — M25652 Stiffness of left hip, not elsewhere classified: Secondary | ICD-10-CM | POA: Diagnosis not present

## 2022-09-06 DIAGNOSIS — M25652 Stiffness of left hip, not elsewhere classified: Secondary | ICD-10-CM | POA: Diagnosis not present

## 2022-09-06 DIAGNOSIS — R262 Difficulty in walking, not elsewhere classified: Secondary | ICD-10-CM | POA: Diagnosis not present

## 2022-09-06 DIAGNOSIS — M25552 Pain in left hip: Secondary | ICD-10-CM | POA: Diagnosis not present

## 2022-09-18 DIAGNOSIS — R262 Difficulty in walking, not elsewhere classified: Secondary | ICD-10-CM | POA: Diagnosis not present

## 2022-09-18 DIAGNOSIS — M25552 Pain in left hip: Secondary | ICD-10-CM | POA: Diagnosis not present

## 2022-09-18 DIAGNOSIS — M25652 Stiffness of left hip, not elsewhere classified: Secondary | ICD-10-CM | POA: Diagnosis not present

## 2022-09-29 DIAGNOSIS — M25552 Pain in left hip: Secondary | ICD-10-CM | POA: Diagnosis not present

## 2022-09-29 DIAGNOSIS — M25652 Stiffness of left hip, not elsewhere classified: Secondary | ICD-10-CM | POA: Diagnosis not present

## 2022-09-29 DIAGNOSIS — R262 Difficulty in walking, not elsewhere classified: Secondary | ICD-10-CM | POA: Diagnosis not present

## 2022-10-09 DIAGNOSIS — K5130 Ulcerative (chronic) rectosigmoiditis without complications: Secondary | ICD-10-CM | POA: Diagnosis not present

## 2022-10-09 DIAGNOSIS — Z86010 Personal history of colonic polyps: Secondary | ICD-10-CM | POA: Diagnosis not present

## 2022-10-09 DIAGNOSIS — K5289 Other specified noninfective gastroenteritis and colitis: Secondary | ICD-10-CM | POA: Diagnosis not present

## 2022-10-09 DIAGNOSIS — K5730 Diverticulosis of large intestine without perforation or abscess without bleeding: Secondary | ICD-10-CM | POA: Diagnosis not present

## 2022-10-09 DIAGNOSIS — Z09 Encounter for follow-up examination after completed treatment for conditions other than malignant neoplasm: Secondary | ICD-10-CM | POA: Diagnosis not present

## 2022-10-09 DIAGNOSIS — K648 Other hemorrhoids: Secondary | ICD-10-CM | POA: Diagnosis not present

## 2022-10-13 DIAGNOSIS — K5289 Other specified noninfective gastroenteritis and colitis: Secondary | ICD-10-CM | POA: Diagnosis not present

## 2023-05-03 ENCOUNTER — Other Ambulatory Visit (HOSPITAL_COMMUNITY): Payer: Self-pay | Admitting: Family Medicine

## 2023-05-03 DIAGNOSIS — E785 Hyperlipidemia, unspecified: Secondary | ICD-10-CM

## 2023-05-17 ENCOUNTER — Ambulatory Visit (HOSPITAL_COMMUNITY)
Admission: RE | Admit: 2023-05-17 | Discharge: 2023-05-17 | Disposition: A | Discharge status: Home / Self Care | Payer: Self-pay | Source: Ambulatory Visit | Attending: Family Medicine | Admitting: Family Medicine

## 2023-05-17 DIAGNOSIS — E785 Hyperlipidemia, unspecified: Secondary | ICD-10-CM | POA: Insufficient documentation

## 2023-06-06 DIAGNOSIS — H52223 Regular astigmatism, bilateral: Secondary | ICD-10-CM | POA: Diagnosis not present

## 2023-06-06 DIAGNOSIS — H5213 Myopia, bilateral: Secondary | ICD-10-CM | POA: Diagnosis not present

## 2023-06-06 DIAGNOSIS — H2513 Age-related nuclear cataract, bilateral: Secondary | ICD-10-CM | POA: Diagnosis not present

## 2023-06-06 DIAGNOSIS — H35033 Hypertensive retinopathy, bilateral: Secondary | ICD-10-CM | POA: Diagnosis not present

## 2023-06-06 DIAGNOSIS — H524 Presbyopia: Secondary | ICD-10-CM | POA: Diagnosis not present

## 2023-06-25 DIAGNOSIS — I10 Essential (primary) hypertension: Secondary | ICD-10-CM | POA: Diagnosis not present

## 2023-06-25 DIAGNOSIS — H25013 Cortical age-related cataract, bilateral: Secondary | ICD-10-CM | POA: Diagnosis not present

## 2023-06-25 DIAGNOSIS — H25043 Posterior subcapsular polar age-related cataract, bilateral: Secondary | ICD-10-CM | POA: Diagnosis not present

## 2023-06-25 DIAGNOSIS — H2511 Age-related nuclear cataract, right eye: Secondary | ICD-10-CM | POA: Diagnosis not present

## 2023-06-25 DIAGNOSIS — H2513 Age-related nuclear cataract, bilateral: Secondary | ICD-10-CM | POA: Diagnosis not present

## 2023-08-06 DIAGNOSIS — E785 Hyperlipidemia, unspecified: Secondary | ICD-10-CM | POA: Diagnosis not present

## 2023-08-08 DIAGNOSIS — M25552 Pain in left hip: Secondary | ICD-10-CM | POA: Diagnosis not present

## 2023-08-29 DIAGNOSIS — M25552 Pain in left hip: Secondary | ICD-10-CM | POA: Diagnosis not present

## 2023-09-05 DIAGNOSIS — M25552 Pain in left hip: Secondary | ICD-10-CM | POA: Diagnosis not present

## 2023-09-11 DIAGNOSIS — M25552 Pain in left hip: Secondary | ICD-10-CM | POA: Diagnosis not present

## 2023-09-13 DIAGNOSIS — M25552 Pain in left hip: Secondary | ICD-10-CM | POA: Diagnosis not present

## 2023-09-18 DIAGNOSIS — M25552 Pain in left hip: Secondary | ICD-10-CM | POA: Diagnosis not present

## 2023-09-20 DIAGNOSIS — M25552 Pain in left hip: Secondary | ICD-10-CM | POA: Diagnosis not present

## 2023-09-21 DIAGNOSIS — I10 Essential (primary) hypertension: Secondary | ICD-10-CM | POA: Diagnosis not present

## 2023-09-25 DIAGNOSIS — M25552 Pain in left hip: Secondary | ICD-10-CM | POA: Diagnosis not present

## 2023-09-27 DIAGNOSIS — M25552 Pain in left hip: Secondary | ICD-10-CM | POA: Diagnosis not present

## 2023-10-02 DIAGNOSIS — M25552 Pain in left hip: Secondary | ICD-10-CM | POA: Diagnosis not present

## 2023-10-04 DIAGNOSIS — M25552 Pain in left hip: Secondary | ICD-10-CM | POA: Diagnosis not present

## 2023-10-08 DIAGNOSIS — N400 Enlarged prostate without lower urinary tract symptoms: Secondary | ICD-10-CM | POA: Diagnosis not present

## 2023-10-08 DIAGNOSIS — I10 Essential (primary) hypertension: Secondary | ICD-10-CM | POA: Diagnosis not present

## 2023-10-08 DIAGNOSIS — E785 Hyperlipidemia, unspecified: Secondary | ICD-10-CM | POA: Diagnosis not present

## 2023-10-09 DIAGNOSIS — M25552 Pain in left hip: Secondary | ICD-10-CM | POA: Diagnosis not present

## 2023-10-20 DIAGNOSIS — I10 Essential (primary) hypertension: Secondary | ICD-10-CM | POA: Diagnosis not present

## 2023-10-25 DIAGNOSIS — K130 Diseases of lips: Secondary | ICD-10-CM | POA: Diagnosis not present

## 2023-10-25 DIAGNOSIS — M1610 Unilateral primary osteoarthritis, unspecified hip: Secondary | ICD-10-CM | POA: Diagnosis not present

## 2023-10-31 DIAGNOSIS — M1612 Unilateral primary osteoarthritis, left hip: Secondary | ICD-10-CM | POA: Diagnosis not present

## 2023-10-31 DIAGNOSIS — M25552 Pain in left hip: Secondary | ICD-10-CM | POA: Diagnosis not present

## 2023-11-01 DIAGNOSIS — D3701 Neoplasm of uncertain behavior of lip: Secondary | ICD-10-CM | POA: Diagnosis not present

## 2023-11-01 DIAGNOSIS — C4402 Squamous cell carcinoma of skin of lip: Secondary | ICD-10-CM | POA: Diagnosis not present

## 2023-11-01 DIAGNOSIS — L578 Other skin changes due to chronic exposure to nonionizing radiation: Secondary | ICD-10-CM | POA: Diagnosis not present

## 2023-11-08 DIAGNOSIS — I10 Essential (primary) hypertension: Secondary | ICD-10-CM | POA: Diagnosis not present

## 2023-11-08 DIAGNOSIS — N400 Enlarged prostate without lower urinary tract symptoms: Secondary | ICD-10-CM | POA: Diagnosis not present

## 2023-11-08 DIAGNOSIS — E785 Hyperlipidemia, unspecified: Secondary | ICD-10-CM | POA: Diagnosis not present

## 2023-11-19 DIAGNOSIS — I10 Essential (primary) hypertension: Secondary | ICD-10-CM | POA: Diagnosis not present

## 2023-12-09 DIAGNOSIS — I10 Essential (primary) hypertension: Secondary | ICD-10-CM | POA: Diagnosis not present

## 2023-12-09 DIAGNOSIS — E785 Hyperlipidemia, unspecified: Secondary | ICD-10-CM | POA: Diagnosis not present

## 2023-12-09 DIAGNOSIS — N400 Enlarged prostate without lower urinary tract symptoms: Secondary | ICD-10-CM | POA: Diagnosis not present

## 2023-12-19 DIAGNOSIS — C4402 Squamous cell carcinoma of skin of lip: Secondary | ICD-10-CM | POA: Diagnosis not present

## 2023-12-19 DIAGNOSIS — C001 Malignant neoplasm of external lower lip: Secondary | ICD-10-CM | POA: Diagnosis not present

## 2023-12-19 DIAGNOSIS — I10 Essential (primary) hypertension: Secondary | ICD-10-CM | POA: Diagnosis not present

## 2023-12-27 ENCOUNTER — Encounter: Payer: Self-pay | Admitting: Dermatology

## 2023-12-27 ENCOUNTER — Telehealth: Payer: Self-pay | Admitting: Radiation Oncology

## 2023-12-27 NOTE — Progress Notes (Signed)
 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 Patient decided to get it checked out.  Past/Anticipated interventions by patient's surgeon/dermatologist for current problematic lesion, if any:  Mohs Surgery   History of Blistering sunburns, if any:  None  SAFETY ISSUES: Prior radiation? None Pacemaker/ICD? None No Loop recorder No pain pump  No Dexcom Possible current pregnancy? None Is the patient on methotrexate? None  Current Complaints / other details:  None

## 2023-12-27 NOTE — Telephone Encounter (Signed)
 Left message for patient to call back to schedule consult per 9/12 referral.

## 2023-12-31 NOTE — Progress Notes (Addendum)
 Radiation Oncology         [phone] ________________________________  Initial outpatient Consultation  Name: Benjamin Massey MRN: [id number]  Date: 01/01/2024  DOB: [birth year]  CC:[facility]  Gladis Husband, MD   REFERRING PHYSICIAN: Gladis Husband, MD  DIAGNOSIS:    ICD-10-CM   1. SCC (squamous cell carcinoma), lip  C44.02     [id number]  Invasive squamous cell carcinoma of lip    CHIEF COMPLAINT: Here to discuss management of skin cancer  HISTORY OF PRESENT ILLNESS::Benjamin Massey is a 78 y.o. male who presented presented to his PCP complaining of a lesion located on the right inferior lip measuring 2.1 x 0.9 cm.   Lesion was then biopsied with pathology indicating squamous cell carcinoma.   In light of findings, he was referred to Dr. Gladis on 12/19/23 to undergo a MOHS procedure. Surgical pathology indicated invasive squamous cell carcinoma with perineural invasion of 0.3 mm.   More notes from discussion with patient today:  He underwent Mohs surgery for lip cancer with significant nerve involvement, with cancer invading a nerve at least 0.3 mm in thickness. He has not previously received radiation therapy.  He requires assistance with transportation due to an upcoming hip replacement surgery, which is planned due to pain but not yet scheduled.  Surgery has been postponed due to his recent cancer diagnosis.  He experiences no numbness, tingling, or shooting pains in the face. There are no lumps or bumps under the chin or in the neck.  He denies immunosuppression.  He does have a history of ulcerative colitis but he reports that this was put in remission by eating a lot of sauerkraut   PHOTOS FROM DERMATOLOGY:        PREVIOUS RADIATION THERAPY: No  PAST MEDICAL HISTORY:  has a past medical history of Acute urinary retention, Bladder stone, Esophageal reflux, Foley catheter in place, History of kidney stones, History of pulmonary embolus (PE)  (04/10/2000), Hypercholesteremia, Hypertension, Mild acid reflux, Prostate cancer (HCC), Skin cancer, and Solitary kidney.    PAST SURGICAL HISTORY: Past Surgical History:  Procedure Laterality Date   CYSTOSCOPY N/A 05/27/2012   Procedure: CYSTOSCOPY;  Surgeon: Thomasine Oiler, MD;  Location: [facility];  Service: Urology;  Laterality: N/A;  REMOVAL OF BLADDER STONE   NEPHRECTOMY  AGE 21   RIGHT KIDNEY--  NO URETER   PROSTATE BIOPSY  07/30/2014   TRANSURETHRAL RESECTION OF PROSTATE N/A 05/27/2012   Procedure: TRANSURETHRAL RESECTION OF THE PROSTATE WITH GYRUS INSTRUMENTS;  Surgeon: Thomasine Oiler, MD;  Location: [facility];  Service: Urology;  Laterality: N/A;   URETEROSCOPIC STONE EXTRACTION W/ STENT PLACEMENT  2002    FAMILY HISTORY: family history includes Alzheimer's disease in his father; Diabetes in his father; Hypertension in his father.  SOCIAL HISTORY:  reports that he has never smoked. He has never used smokeless tobacco. He reports current alcohol use. He reports that he does not use drugs.  ALLERGIES: Patient has no known allergies.  MEDICATIONS:  Current Outpatient Medications  Medication Sig Dispense Refill   acetaminophen  (TYLENOL ) 325 MG tablet Take 650 mg by mouth every 6 (six) hours as needed.     amLODipine (NORVASC) 10 MG tablet Take 10 mg by mouth every morning.      Cholecalciferol (VITAMIN D) 50 MCG (2000 UT) CAPS Take 1 capsule by mouth 2 (two) times daily.     COCONUT OIL PO Take 1 tablet by mouth 2 (two) times  daily.     Coenzyme Q10 (CO Q-10) 100 MG CAPS Take 1 capsule by mouth daily.     Krill Oil 1000 MG CAPS Take 2 capsules by mouth 2 (two) times daily.     Lutein-Zeaxanthin (VITEYES ESSENTIALS VISION SUPP PO) Take 1 capsule by mouth daily.     Magnesium Citrate 100 MG TABS Take 1 tablet by mouth daily.     Misc Natural Products (PROSTATE HEALTH PO) Take 2 capsules by mouth.     triamterene-hydrochlorothiazide (MAXZIDE-25)  37.5-25 MG per tablet Take 1 tablet by mouth every morning.      Turmeric (CURCUMIN 95 PO) Take 2 capsules by mouth daily.     magnesium oxide (MAG-OX) 400 MG tablet Take 400 mg by mouth every morning.     No current facility-administered medications for this encounter.    REVIEW OF SYSTEMS:  Notable for that above.   PHYSICAL EXAM:  height is 5' 3 (1.6 m) and weight is 159 lb 6.4 oz (72.3 kg). His temperature is 97.8 F (36.6 C). His blood pressure is 140/67 (abnormal) and his pulse is 80. His respiration is 20 and oxygen saturation is 100%.   General: Alert and oriented, in no acute distress  HEENT: Lip (lower) healing well post-surgery. NECK: No cervical lymphadenopathy. No lymphadenopathy in face, neck, or periauricular area. CHEST: Lungs clear to auscultation bilaterally. CARDIOVASCULAR: Regular rate and rhythm, no murmurs. ABDOMEN: Abdomen soft, non-tender, non-distended. EXTREMITIES: Bilateral ankle edema, right greater than left. Lymphatics: see Neck Exam Skin: No concerning lesions. Musculoskeletal: He presents in a wheelchair with a cane Neurologic: Cranial nerves II through XII are grossly intact. No obvious focalities. Speech is fluent. Coordination is intact. Psychiatric: Judgment and insight are intact. Affect is appropriate.  ECOG = 2  0 - Asymptomatic (Fully active, able to carry on all predisease activities without restriction)  1 - Symptomatic but completely ambulatory (Restricted in physically strenuous activity but ambulatory and able to carry out work of a light or sedentary nature. For example, light housework, office work)  2 - Symptomatic, <50% in bed during the day (Ambulatory and capable of all self care but unable to carry out any work activities. Up and about more than 50% of waking hours)  3 - Symptomatic, >50% in bed, but not bedbound (Capable of only limited self-care, confined to bed or chair 50% or more of waking hours)  4 - Bedbound (Completely  disabled. Cannot carry on any self-care. Totally confined to bed or chair)  5 - Death   Raylene MM, Creech RH, Tormey DC, et al. [phone]). Toxicity and response criteria of the [facility] [facility]. Am. DOROTHA Bridges. Oncol. 5 (6): 649-55   LABORATORY DATA:  Lab Results  Component Value Date   WBC 18.7 (H) 05/28/2012   HGB 13.6 05/28/2012   HCT 41.1 05/28/2012   MCV 84.0 05/28/2012   PLT 446 (H) 05/28/2012   CMP     Component Value Date/Time   NA 132 (L) 05/28/2012 0630   K 3.5 05/28/2012 0630   CL 96 05/28/2012 0630   CO2 17 (L) 05/28/2012 0630   GLUCOSE 253 (H) 05/28/2012 0630   BUN 10 05/28/2012 0630   CREATININE 0.99 05/28/2012 0630   CALCIUM 9.2 05/28/2012 0630   GFRNONAA 85 (L) 05/28/2012 0630         RADIOGRAPHY: No results found.    IMPRESSION/PLAN:     Squamous cell carcinoma of the lower lip with perineural invasion Post-Mohs surgery revealed  nerve invasion, necessitating adjuvant radiation therapy to reduce recurrence risk. No prior radiation therapy, immunosuppression, or lymphadenopathy. Large preoperative lesion and significant perineural invasion indicates aggressive cancer. - Order CT scan of the neck to assess  lymph nodes. - Order MRI of the face to assess nerve involvement   - Arrange radiation planning with nondiagnostic CT scan and mask fabrication. - Plan radiation therapy to lower lip, five days a week for four weeks. - Discussed side effects: chapped lips, mucosal irritation, fatigue, hair loss, altered taste, lip swelling. - Provide viscous lidocaine  for lip irritation. - Recommend plain [place] yogurt for mucosal irritation. - Arrange nutritionist consultation for side effect management and recovery. - Provided after-visit summary and consent form for radiation therapy.  ADDENDUM: The patient's CT and MRI were negative for residual disease.  I was able to get in touch with Dr. Gladis and obtain some additional preoperative and  intraoperative photos.  Those have been attached above in the history of present illness.  The patient's final defect size was 3.4 cm horizontally and 1.4 cm vertically.  On date of service, in total, I spent 60 minutes on this encounter. Patient was seen in person.   __________________________________________   Lauraine Golden, MD  This document serves as a record of services personally performed by Lauraine Golden, MD. It was created on her behalf by Reymundo Cartwright, a trained medical scribe. The creation of this record is based on the scribe's personal observations and the provider's statements to them. This document has been checked and approved by the attending provider.

## 2024-01-01 ENCOUNTER — Ambulatory Visit
Admission: RE | Admit: 2024-01-01 | Discharge: 2024-01-01 | Disposition: A | Discharge status: Home / Self Care | Source: Ambulatory Visit | Attending: Radiation Oncology | Admitting: Radiation Oncology

## 2024-01-01 ENCOUNTER — Encounter: Payer: Self-pay | Admitting: Radiation Oncology

## 2024-01-01 ENCOUNTER — Other Ambulatory Visit: Payer: Self-pay

## 2024-01-01 VITALS — BP 140/67 | HR 80 | Temp 97.8°F | Resp 20 | Ht 63.0 in | Wt 159.4 lb

## 2024-01-01 DIAGNOSIS — K5190 Ulcerative colitis, unspecified, without complications: Secondary | ICD-10-CM | POA: Insufficient documentation

## 2024-01-01 DIAGNOSIS — E7800 Pure hypercholesterolemia, unspecified: Secondary | ICD-10-CM | POA: Diagnosis not present

## 2024-01-01 DIAGNOSIS — Z85828 Personal history of other malignant neoplasm of skin: Secondary | ICD-10-CM | POA: Diagnosis not present

## 2024-01-01 DIAGNOSIS — Z86711 Personal history of pulmonary embolism: Secondary | ICD-10-CM | POA: Diagnosis not present

## 2024-01-01 DIAGNOSIS — Z85028 Personal history of other malignant neoplasm of stomach: Secondary | ICD-10-CM | POA: Insufficient documentation

## 2024-01-01 DIAGNOSIS — C4402 Squamous cell carcinoma of skin of lip: Secondary | ICD-10-CM | POA: Insufficient documentation

## 2024-01-01 DIAGNOSIS — Z79899 Other long term (current) drug therapy: Secondary | ICD-10-CM | POA: Diagnosis not present

## 2024-01-01 DIAGNOSIS — I10 Essential (primary) hypertension: Secondary | ICD-10-CM | POA: Insufficient documentation

## 2024-01-01 DIAGNOSIS — Z87442 Personal history of urinary calculi: Secondary | ICD-10-CM | POA: Insufficient documentation

## 2024-01-01 HISTORY — DX: Unspecified malignant neoplasm of skin, unspecified: C44.90

## 2024-01-01 NOTE — Progress Notes (Signed)
 Oncology Nurse Navigator Documentation   Met with patient before his initial consult with Dr. Izell.  I introduced myself as his/their Navigator, explained my role as a member of the Care Team. Assisted with post-consult appt scheduling.    He verbalized understanding of information provided. I encouraged them to call with questions/concerns moving forward.  Delon Jefferson, RN, BSN, OCN Head & Neck Oncology Nurse Navigator [facility] [place] [phone]

## 2024-01-03 ENCOUNTER — Other Ambulatory Visit: Payer: Self-pay

## 2024-01-03 DIAGNOSIS — C001 Malignant neoplasm of external lower lip: Secondary | ICD-10-CM

## 2024-01-08 DIAGNOSIS — I10 Essential (primary) hypertension: Secondary | ICD-10-CM | POA: Diagnosis not present

## 2024-01-08 DIAGNOSIS — N400 Enlarged prostate without lower urinary tract symptoms: Secondary | ICD-10-CM | POA: Diagnosis not present

## 2024-01-08 DIAGNOSIS — E785 Hyperlipidemia, unspecified: Secondary | ICD-10-CM | POA: Diagnosis not present

## 2024-01-10 ENCOUNTER — Ambulatory Visit (HOSPITAL_COMMUNITY)
Admission: RE | Admit: 2024-01-10 | Discharge: 2024-01-10 | Disposition: A | Discharge status: Home / Self Care | Source: Ambulatory Visit | Attending: Radiation Oncology | Admitting: Radiation Oncology

## 2024-01-10 ENCOUNTER — Encounter (HOSPITAL_COMMUNITY): Payer: Self-pay

## 2024-01-10 DIAGNOSIS — C4402 Squamous cell carcinoma of skin of lip: Secondary | ICD-10-CM | POA: Diagnosis not present

## 2024-01-10 MED ORDER — GADOBUTROL 1 MMOL/ML IV SOLN
7.0000 mL | Freq: Once | INTRAVENOUS | Status: AC | PRN
Start: 2024-01-10 — End: 2024-01-10
  Administered 2024-01-10: 7 mL via INTRAVENOUS

## 2024-01-10 MED ORDER — IOHEXOL 300 MG/ML  SOLN
60.0000 mL | Freq: Once | INTRAMUSCULAR | Status: AC | PRN
  Administered 2024-01-10: 60 mL via INTRAVENOUS

## 2024-01-18 ENCOUNTER — Ambulatory Visit
Admission: RE | Admit: 2024-01-18 | Discharge: 2024-01-18 | Disposition: A | Discharge status: Home / Self Care | Source: Ambulatory Visit | Attending: Radiation Oncology | Admitting: Radiation Oncology

## 2024-01-18 ENCOUNTER — Inpatient Hospital Stay: Attending: Nutrition | Admitting: Dietician

## 2024-01-18 DIAGNOSIS — Z510 Encounter for antineoplastic radiation therapy: Secondary | ICD-10-CM | POA: Diagnosis not present

## 2024-01-18 DIAGNOSIS — I10 Essential (primary) hypertension: Secondary | ICD-10-CM | POA: Diagnosis not present

## 2024-01-18 DIAGNOSIS — C4402 Squamous cell carcinoma of skin of lip: Secondary | ICD-10-CM | POA: Insufficient documentation

## 2024-01-18 NOTE — Progress Notes (Signed)
 Nutrition Assessment   Reason for Assessment: HNC   ASSESSMENT: 78 year old male with SCC of vermilion border of lower lip. S/p MOHS. He is planning start of adjuvant radiation under the care of Dr. Izell.   Past medical history significant for prostate cancer, UC, GERD, PE (2002), HTN, solitary kidney, foley in place, HLD  Met with patient in office following SIM. He reports doing okay overall. 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 Had to reschedule arthroplasty which is now planned in January. Patient denies any changes in appetite or intake. Eating 3 meals/day. Bowl of cherrios with whole milk for breakfast, sandwich/sub for lunch. Ate spaghetti with meat balls and italian sausage for dinner. Patient has a fridge full of Ensure which he consumed after MOHS.   Medications: vit D, norvasc, Co Q-10, mag-ox, maxzide, tumeric   Labs: no recent labs    Anthropometrics:   Height: 5'3 Weight: 159 lb 6.4 oz (9/23) UBW: 160-165 lb (per pt) BMI: 28.24   NUTRITION DIAGNOSIS: Predicted sub-optimal intake related to cancer as evidenced by RT to Benjamin Massey [facility] of lower lip likely to affect ability to eat and drink    INTERVENTION:  Educated on importance of adequate calories and protein to maintain weights/strength during treatment  Continue regular diet, recommend protein foods at every meal - soft moist high protein foods + snack ideas provided  Suggested daily Ensure for added calories and protein - samples + coupons  Contact information    MONITORING, EVALUATION, GOAL: Pt will tolerate adequate calories and protein to minimize wt loss during treatment    Next Visit: Tuesday October 28 after radiation

## 2024-01-18 NOTE — Addendum Note (Signed)
 Encounter addended by: Izell Domino, MD on: 01/18/2024 9:00 AM  Actions taken: Clinical Note Signed

## 2024-01-21 DIAGNOSIS — Z23 Encounter for immunization: Secondary | ICD-10-CM | POA: Diagnosis not present

## 2024-01-21 DIAGNOSIS — C4402 Squamous cell carcinoma of skin of lip: Secondary | ICD-10-CM | POA: Diagnosis not present

## 2024-01-21 DIAGNOSIS — R7303 Prediabetes: Secondary | ICD-10-CM | POA: Diagnosis not present

## 2024-01-21 DIAGNOSIS — I10 Essential (primary) hypertension: Secondary | ICD-10-CM | POA: Diagnosis not present

## 2024-01-21 DIAGNOSIS — H6122 Impacted cerumen, left ear: Secondary | ICD-10-CM | POA: Diagnosis not present

## 2024-01-21 DIAGNOSIS — Z01818 Encounter for other preprocedural examination: Secondary | ICD-10-CM | POA: Diagnosis not present

## 2024-01-21 DIAGNOSIS — E785 Hyperlipidemia, unspecified: Secondary | ICD-10-CM | POA: Diagnosis not present

## 2024-01-21 DIAGNOSIS — R931 Abnormal findings on diagnostic imaging of heart and coronary circulation: Secondary | ICD-10-CM | POA: Diagnosis not present

## 2024-01-21 DIAGNOSIS — Z510 Encounter for antineoplastic radiation therapy: Secondary | ICD-10-CM | POA: Diagnosis not present

## 2024-01-21 DIAGNOSIS — Z789 Other specified health status: Secondary | ICD-10-CM | POA: Diagnosis not present

## 2024-01-28 ENCOUNTER — Ambulatory Visit: Admitting: Radiation Oncology

## 2024-01-28 ENCOUNTER — Ambulatory Visit

## 2024-01-29 ENCOUNTER — Ambulatory Visit
Admission: RE | Admit: 2024-01-29 | Discharge: 2024-01-29 | Disposition: A | Source: Ambulatory Visit | Attending: Radiation Oncology

## 2024-01-29 ENCOUNTER — Other Ambulatory Visit: Payer: Self-pay

## 2024-01-29 ENCOUNTER — Ambulatory Visit
Admission: RE | Admit: 2024-01-29 | Discharge: 2024-01-29 | Disposition: A | Discharge status: Home / Self Care | Source: Ambulatory Visit | Attending: Radiation Oncology | Admitting: Radiation Oncology

## 2024-01-29 DIAGNOSIS — Z510 Encounter for antineoplastic radiation therapy: Secondary | ICD-10-CM | POA: Diagnosis not present

## 2024-01-29 LAB — RAD ONC ARIA SESSION SUMMARY
Course Elapsed Days: 0
Plan Fractions Treated to Date: 1
Plan Prescribed Dose Per Fraction: 2.5 Gy
Plan Total Fractions Prescribed: 20
Plan Total Prescribed Dose: 50 Gy
Reference Point Dosage Given to Date: 2.5 Gy
Reference Point Session Dosage Given: 2.5 Gy
Session Number: 1

## 2024-01-30 ENCOUNTER — Other Ambulatory Visit: Payer: Self-pay

## 2024-01-30 ENCOUNTER — Ambulatory Visit
Admission: RE | Admit: 2024-01-30 | Discharge: 2024-01-30 | Disposition: A | Discharge status: Home / Self Care | Source: Ambulatory Visit | Attending: Radiation Oncology | Admitting: Radiation Oncology

## 2024-01-30 DIAGNOSIS — Z510 Encounter for antineoplastic radiation therapy: Secondary | ICD-10-CM | POA: Diagnosis not present

## 2024-01-30 LAB — RAD ONC ARIA SESSION SUMMARY
Course Elapsed Days: 1
Plan Fractions Treated to Date: 2
Plan Prescribed Dose Per Fraction: 2.5 Gy
Plan Total Fractions Prescribed: 20
Plan Total Prescribed Dose: 50 Gy
Reference Point Dosage Given to Date: 5 Gy
Reference Point Session Dosage Given: 2.5 Gy
Session Number: 2

## 2024-01-30 NOTE — Progress Notes (Signed)
 Oncology Nurse Navigator Documentation   Mr. Godek completed his first radiation treatment on 01/29/24 without difficulty. He knows to call me if he has any questions or concerns as he progresses through treatment.    Delon Jefferson RN, BSN, OCN Head & Neck Oncology Nurse Navigator [facility] at [facility] Phone # [phone]  Fax # [phone]

## 2024-01-31 ENCOUNTER — Other Ambulatory Visit: Payer: Self-pay

## 2024-01-31 ENCOUNTER — Ambulatory Visit
Admission: RE | Admit: 2024-01-31 | Discharge: 2024-01-31 | Disposition: A | Discharge status: Home / Self Care | Source: Ambulatory Visit | Attending: Radiation Oncology | Admitting: Radiation Oncology

## 2024-01-31 DIAGNOSIS — Z510 Encounter for antineoplastic radiation therapy: Secondary | ICD-10-CM | POA: Diagnosis not present

## 2024-01-31 LAB — RAD ONC ARIA SESSION SUMMARY
Course Elapsed Days: 2
Plan Fractions Treated to Date: 3
Plan Prescribed Dose Per Fraction: 2.5 Gy
Plan Total Fractions Prescribed: 20
Plan Total Prescribed Dose: 50 Gy
Reference Point Dosage Given to Date: 7.5 Gy
Reference Point Session Dosage Given: 2.5 Gy
Session Number: 3

## 2024-02-01 ENCOUNTER — Ambulatory Visit
Admission: RE | Admit: 2024-02-01 | Discharge: 2024-02-01 | Disposition: A | Discharge status: Home / Self Care | Source: Ambulatory Visit | Attending: Radiation Oncology | Admitting: Radiation Oncology

## 2024-02-01 ENCOUNTER — Other Ambulatory Visit: Payer: Self-pay

## 2024-02-01 DIAGNOSIS — Z510 Encounter for antineoplastic radiation therapy: Secondary | ICD-10-CM | POA: Diagnosis not present

## 2024-02-01 LAB — RAD ONC ARIA SESSION SUMMARY
Course Elapsed Days: 3
Plan Fractions Treated to Date: 4
Plan Prescribed Dose Per Fraction: 2.5 Gy
Plan Total Fractions Prescribed: 20
Plan Total Prescribed Dose: 50 Gy
Reference Point Dosage Given to Date: 10 Gy
Reference Point Session Dosage Given: 2.5 Gy
Session Number: 4

## 2024-02-04 ENCOUNTER — Ambulatory Visit
Admission: RE | Admit: 2024-02-04 | Discharge: 2024-02-04 | Disposition: A | Discharge status: Home / Self Care | Source: Ambulatory Visit | Attending: Radiation Oncology | Admitting: Radiation Oncology

## 2024-02-04 ENCOUNTER — Other Ambulatory Visit: Payer: Self-pay

## 2024-02-04 DIAGNOSIS — Z510 Encounter for antineoplastic radiation therapy: Secondary | ICD-10-CM | POA: Diagnosis not present

## 2024-02-04 DIAGNOSIS — C4402 Squamous cell carcinoma of skin of lip: Secondary | ICD-10-CM | POA: Diagnosis not present

## 2024-02-04 LAB — RAD ONC ARIA SESSION SUMMARY
Course Elapsed Days: 6
Plan Fractions Treated to Date: 5
Plan Prescribed Dose Per Fraction: 2.5 Gy
Plan Total Fractions Prescribed: 20
Plan Total Prescribed Dose: 50 Gy
Reference Point Dosage Given to Date: 12.5 Gy
Reference Point Session Dosage Given: 2.5 Gy
Session Number: 5

## 2024-02-05 ENCOUNTER — Ambulatory Visit
Admission: RE | Admit: 2024-02-05 | Discharge: 2024-02-05 | Disposition: A | Discharge status: Home / Self Care | Source: Ambulatory Visit | Attending: Radiation Oncology | Admitting: Radiation Oncology

## 2024-02-05 ENCOUNTER — Inpatient Hospital Stay: Admitting: Dietician

## 2024-02-05 ENCOUNTER — Other Ambulatory Visit: Payer: Self-pay

## 2024-02-05 DIAGNOSIS — Z510 Encounter for antineoplastic radiation therapy: Secondary | ICD-10-CM | POA: Diagnosis not present

## 2024-02-05 LAB — RAD ONC ARIA SESSION SUMMARY
Course Elapsed Days: 7
Plan Fractions Treated to Date: 6
Plan Prescribed Dose Per Fraction: 2.5 Gy
Plan Total Fractions Prescribed: 20
Plan Total Prescribed Dose: 50 Gy
Reference Point Dosage Given to Date: 15 Gy
Reference Point Session Dosage Given: 2.5 Gy
Session Number: 6

## 2024-02-05 NOTE — Progress Notes (Signed)
 Received call from scheduling reporting radiation running behind and pt would be late arriving to nutrition. RD waited, however patient did not come to nutrition appointment after treatment. Nutrition appointment rescheduled for 11/12.

## 2024-02-06 ENCOUNTER — Other Ambulatory Visit: Payer: Self-pay

## 2024-02-06 ENCOUNTER — Ambulatory Visit
Admission: RE | Admit: 2024-02-06 | Discharge: 2024-02-06 | Disposition: A | Source: Ambulatory Visit | Attending: Radiation Oncology

## 2024-02-06 DIAGNOSIS — Z510 Encounter for antineoplastic radiation therapy: Secondary | ICD-10-CM | POA: Diagnosis not present

## 2024-02-06 LAB — RAD ONC ARIA SESSION SUMMARY
Course Elapsed Days: 8
Plan Fractions Treated to Date: 7
Plan Prescribed Dose Per Fraction: 2.5 Gy
Plan Total Fractions Prescribed: 20
Plan Total Prescribed Dose: 50 Gy
Reference Point Dosage Given to Date: 17.5 Gy
Reference Point Session Dosage Given: 2.5 Gy
Session Number: 7

## 2024-02-07 ENCOUNTER — Other Ambulatory Visit: Payer: Self-pay

## 2024-02-07 ENCOUNTER — Ambulatory Visit
Admission: RE | Admit: 2024-02-07 | Discharge: 2024-02-07 | Disposition: A | Source: Ambulatory Visit | Attending: Radiation Oncology

## 2024-02-07 DIAGNOSIS — Z510 Encounter for antineoplastic radiation therapy: Secondary | ICD-10-CM | POA: Diagnosis not present

## 2024-02-07 LAB — RAD ONC ARIA SESSION SUMMARY
Course Elapsed Days: 9
Plan Fractions Treated to Date: 8
Plan Prescribed Dose Per Fraction: 2.5 Gy
Plan Total Fractions Prescribed: 20
Plan Total Prescribed Dose: 50 Gy
Reference Point Dosage Given to Date: 20 Gy
Reference Point Session Dosage Given: 2.5 Gy
Session Number: 8

## 2024-02-08 ENCOUNTER — Ambulatory Visit
Admission: RE | Admit: 2024-02-08 | Discharge: 2024-02-08 | Disposition: A | Discharge status: Home / Self Care | Source: Ambulatory Visit | Attending: Radiation Oncology | Admitting: Radiation Oncology

## 2024-02-08 ENCOUNTER — Other Ambulatory Visit: Payer: Self-pay

## 2024-02-08 DIAGNOSIS — I10 Essential (primary) hypertension: Secondary | ICD-10-CM | POA: Diagnosis not present

## 2024-02-08 DIAGNOSIS — Z510 Encounter for antineoplastic radiation therapy: Secondary | ICD-10-CM | POA: Diagnosis not present

## 2024-02-08 DIAGNOSIS — N400 Enlarged prostate without lower urinary tract symptoms: Secondary | ICD-10-CM | POA: Diagnosis not present

## 2024-02-08 DIAGNOSIS — E785 Hyperlipidemia, unspecified: Secondary | ICD-10-CM | POA: Diagnosis not present

## 2024-02-08 LAB — RAD ONC ARIA SESSION SUMMARY
Course Elapsed Days: 10
Plan Fractions Treated to Date: 9
Plan Prescribed Dose Per Fraction: 2.5 Gy
Plan Total Fractions Prescribed: 20
Plan Total Prescribed Dose: 50 Gy
Reference Point Dosage Given to Date: 22.5 Gy
Reference Point Session Dosage Given: 2.5 Gy
Session Number: 9

## 2024-02-11 ENCOUNTER — Ambulatory Visit
Admission: RE | Admit: 2024-02-11 | Discharge: 2024-02-11 | Disposition: A | Discharge status: Home / Self Care | Source: Ambulatory Visit | Attending: Radiation Oncology | Admitting: Radiation Oncology

## 2024-02-11 ENCOUNTER — Other Ambulatory Visit: Payer: Self-pay

## 2024-02-11 DIAGNOSIS — C4402 Squamous cell carcinoma of skin of lip: Secondary | ICD-10-CM | POA: Insufficient documentation

## 2024-02-11 DIAGNOSIS — Z510 Encounter for antineoplastic radiation therapy: Secondary | ICD-10-CM | POA: Insufficient documentation

## 2024-02-11 LAB — RAD ONC ARIA SESSION SUMMARY
Course Elapsed Days: 13
Plan Fractions Treated to Date: 10
Plan Prescribed Dose Per Fraction: 2.5 Gy
Plan Total Fractions Prescribed: 20
Plan Total Prescribed Dose: 50 Gy
Reference Point Dosage Given to Date: 25 Gy
Reference Point Session Dosage Given: 2.5 Gy
Session Number: 10

## 2024-02-12 ENCOUNTER — Ambulatory Visit
Admission: RE | Admit: 2024-02-12 | Discharge: 2024-02-12 | Disposition: A | Discharge status: Home / Self Care | Source: Ambulatory Visit | Attending: Radiation Oncology | Admitting: Radiation Oncology

## 2024-02-12 ENCOUNTER — Other Ambulatory Visit: Payer: Self-pay

## 2024-02-12 DIAGNOSIS — Z510 Encounter for antineoplastic radiation therapy: Secondary | ICD-10-CM | POA: Diagnosis not present

## 2024-02-12 LAB — RAD ONC ARIA SESSION SUMMARY
Course Elapsed Days: 14
Plan Fractions Treated to Date: 11
Plan Prescribed Dose Per Fraction: 2.5 Gy
Plan Total Fractions Prescribed: 20
Plan Total Prescribed Dose: 50 Gy
Reference Point Dosage Given to Date: 27.5 Gy
Reference Point Session Dosage Given: 2.5 Gy
Session Number: 11

## 2024-02-13 ENCOUNTER — Other Ambulatory Visit: Payer: Self-pay

## 2024-02-13 ENCOUNTER — Ambulatory Visit
Admission: RE | Admit: 2024-02-13 | Discharge: 2024-02-13 | Disposition: A | Discharge status: Home / Self Care | Source: Ambulatory Visit | Attending: Radiation Oncology | Admitting: Radiation Oncology

## 2024-02-13 DIAGNOSIS — Z510 Encounter for antineoplastic radiation therapy: Secondary | ICD-10-CM | POA: Diagnosis not present

## 2024-02-13 LAB — RAD ONC ARIA SESSION SUMMARY
Course Elapsed Days: 15
Plan Fractions Treated to Date: 12
Plan Prescribed Dose Per Fraction: 2.5 Gy
Plan Total Fractions Prescribed: 20
Plan Total Prescribed Dose: 50 Gy
Reference Point Dosage Given to Date: 30 Gy
Reference Point Session Dosage Given: 2.5 Gy
Session Number: 12

## 2024-02-14 ENCOUNTER — Ambulatory Visit
Admission: RE | Admit: 2024-02-14 | Discharge: 2024-02-14 | Disposition: A | Discharge status: Home / Self Care | Source: Ambulatory Visit | Attending: Radiation Oncology | Admitting: Radiation Oncology

## 2024-02-14 ENCOUNTER — Other Ambulatory Visit: Payer: Self-pay

## 2024-02-14 DIAGNOSIS — Z510 Encounter for antineoplastic radiation therapy: Secondary | ICD-10-CM | POA: Diagnosis not present

## 2024-02-14 LAB — RAD ONC ARIA SESSION SUMMARY
Course Elapsed Days: 16
Plan Fractions Treated to Date: 13
Plan Prescribed Dose Per Fraction: 2.5 Gy
Plan Total Fractions Prescribed: 20
Plan Total Prescribed Dose: 50 Gy
Reference Point Dosage Given to Date: 32.5 Gy
Reference Point Session Dosage Given: 2.5 Gy
Session Number: 13

## 2024-02-15 ENCOUNTER — Ambulatory Visit
Admission: RE | Admit: 2024-02-15 | Discharge: 2024-02-15 | Disposition: A | Discharge status: Home / Self Care | Source: Ambulatory Visit | Attending: Radiation Oncology | Admitting: Radiation Oncology

## 2024-02-15 ENCOUNTER — Other Ambulatory Visit: Payer: Self-pay

## 2024-02-15 DIAGNOSIS — Z510 Encounter for antineoplastic radiation therapy: Secondary | ICD-10-CM | POA: Diagnosis not present

## 2024-02-15 LAB — RAD ONC ARIA SESSION SUMMARY
Course Elapsed Days: 17
Plan Fractions Treated to Date: 14
Plan Prescribed Dose Per Fraction: 2.5 Gy
Plan Total Fractions Prescribed: 20
Plan Total Prescribed Dose: 50 Gy
Reference Point Dosage Given to Date: 35 Gy
Reference Point Session Dosage Given: 2.5 Gy
Session Number: 14

## 2024-02-17 DIAGNOSIS — I10 Essential (primary) hypertension: Secondary | ICD-10-CM | POA: Diagnosis not present

## 2024-02-18 ENCOUNTER — Other Ambulatory Visit: Payer: Self-pay

## 2024-02-18 ENCOUNTER — Ambulatory Visit
Admission: RE | Admit: 2024-02-18 | Discharge: 2024-02-18 | Disposition: A | Discharge status: Home / Self Care | Source: Ambulatory Visit | Attending: Radiation Oncology | Admitting: Radiation Oncology

## 2024-02-18 DIAGNOSIS — Z510 Encounter for antineoplastic radiation therapy: Secondary | ICD-10-CM | POA: Diagnosis not present

## 2024-02-18 DIAGNOSIS — C4402 Squamous cell carcinoma of skin of lip: Secondary | ICD-10-CM | POA: Diagnosis not present

## 2024-02-18 LAB — RAD ONC ARIA SESSION SUMMARY
Course Elapsed Days: 20
Plan Fractions Treated to Date: 15
Plan Prescribed Dose Per Fraction: 2.5 Gy
Plan Total Fractions Prescribed: 20
Plan Total Prescribed Dose: 50 Gy
Reference Point Dosage Given to Date: 37.5 Gy
Reference Point Session Dosage Given: 2.5 Gy
Session Number: 15

## 2024-02-19 ENCOUNTER — Ambulatory Visit

## 2024-02-19 ENCOUNTER — Ambulatory Visit
Admission: RE | Admit: 2024-02-19 | Discharge: 2024-02-19 | Disposition: A | Discharge status: Home / Self Care | Source: Ambulatory Visit | Attending: Radiation Oncology | Admitting: Radiation Oncology

## 2024-02-19 ENCOUNTER — Other Ambulatory Visit: Payer: Self-pay

## 2024-02-19 DIAGNOSIS — Z510 Encounter for antineoplastic radiation therapy: Secondary | ICD-10-CM | POA: Diagnosis not present

## 2024-02-19 LAB — RAD ONC ARIA SESSION SUMMARY
Course Elapsed Days: 21
Plan Fractions Treated to Date: 16
Plan Prescribed Dose Per Fraction: 2.5 Gy
Plan Total Fractions Prescribed: 20
Plan Total Prescribed Dose: 50 Gy
Reference Point Dosage Given to Date: 40 Gy
Reference Point Session Dosage Given: 2.5 Gy
Session Number: 16

## 2024-02-20 ENCOUNTER — Ambulatory Visit
Admission: RE | Admit: 2024-02-20 | Discharge: 2024-02-20 | Disposition: A | Discharge status: Home / Self Care | Source: Ambulatory Visit | Attending: Radiation Oncology | Admitting: Radiation Oncology

## 2024-02-20 ENCOUNTER — Other Ambulatory Visit: Payer: Self-pay

## 2024-02-20 ENCOUNTER — Inpatient Hospital Stay: Attending: Nutrition | Admitting: Dietician

## 2024-02-20 DIAGNOSIS — Z510 Encounter for antineoplastic radiation therapy: Secondary | ICD-10-CM | POA: Diagnosis not present

## 2024-02-20 LAB — RAD ONC ARIA SESSION SUMMARY
Course Elapsed Days: 22
Plan Fractions Treated to Date: 17
Plan Prescribed Dose Per Fraction: 2.5 Gy
Plan Total Fractions Prescribed: 20
Plan Total Prescribed Dose: 50 Gy
Reference Point Dosage Given to Date: 42.5 Gy
Reference Point Session Dosage Given: 2.5 Gy
Session Number: 17

## 2024-02-20 NOTE — Progress Notes (Signed)
 Nutrition Follow-up:  Pt with SCC of vermilion border of lower lip. S/p MOHS. He is planning start of adjuvant radiation under the care of Dr. Izell.   Met with patient in office after radiation. He reports tolerating treatment. Ready for it to be finished. He has 3 more to go. Bottom lip swollen, cracked, and red. Patient reports good appetite and eating well despite painful lip. Eating mostly soups and smooth foods (clam chowder, vegetable beef, chicken noodle, yogurt, ice cream). He is drinking one Ensure Complete.   Medications: reviewed   Labs: no new labs for review   Anthropometrics: Wt 160.2 lb on 11/10  10/27 - 161.2 lb  9/23 - 159 lb 6.4 oz    NUTRITION DIAGNOSIS: Predicted sub-optimal intake ongoing - addressing with ONS    INTERVENTION:  Encourage high calorie high protein foods in smooth textures Continue daily Ensure Complete/equivalent - samples + coupons provided      MONITORING, EVALUATION, GOAL: wt trends, intake    NEXT VISIT: To be scheduled as needed. Pt has contact information. Encouraged to call with nutrition questions/concerns

## 2024-02-21 ENCOUNTER — Other Ambulatory Visit: Payer: Self-pay

## 2024-02-21 ENCOUNTER — Ambulatory Visit
Admission: RE | Admit: 2024-02-21 | Discharge: 2024-02-21 | Disposition: A | Source: Ambulatory Visit | Attending: Radiation Oncology

## 2024-02-21 DIAGNOSIS — Z510 Encounter for antineoplastic radiation therapy: Secondary | ICD-10-CM | POA: Diagnosis not present

## 2024-02-21 LAB — RAD ONC ARIA SESSION SUMMARY
Course Elapsed Days: 23
Plan Fractions Treated to Date: 18
Plan Prescribed Dose Per Fraction: 2.5 Gy
Plan Total Fractions Prescribed: 20
Plan Total Prescribed Dose: 50 Gy
Reference Point Dosage Given to Date: 45 Gy
Reference Point Session Dosage Given: 2.5 Gy
Session Number: 18

## 2024-02-22 ENCOUNTER — Ambulatory Visit
Admission: RE | Admit: 2024-02-22 | Discharge: 2024-02-22 | Disposition: A | Discharge status: Home / Self Care | Source: Ambulatory Visit | Attending: Radiation Oncology | Admitting: Radiation Oncology

## 2024-02-22 ENCOUNTER — Other Ambulatory Visit: Payer: Self-pay

## 2024-02-22 ENCOUNTER — Ambulatory Visit

## 2024-02-22 DIAGNOSIS — Z510 Encounter for antineoplastic radiation therapy: Secondary | ICD-10-CM | POA: Diagnosis not present

## 2024-02-22 LAB — RAD ONC ARIA SESSION SUMMARY
Course Elapsed Days: 24
Plan Fractions Treated to Date: 19
Plan Prescribed Dose Per Fraction: 2.5 Gy
Plan Total Fractions Prescribed: 20
Plan Total Prescribed Dose: 50 Gy
Reference Point Dosage Given to Date: 47.5 Gy
Reference Point Session Dosage Given: 2.5 Gy
Session Number: 19

## 2024-02-25 ENCOUNTER — Other Ambulatory Visit: Payer: Self-pay

## 2024-02-25 ENCOUNTER — Ambulatory Visit
Admission: RE | Admit: 2024-02-25 | Discharge: 2024-02-25 | Disposition: A | Discharge status: Home / Self Care | Source: Ambulatory Visit | Attending: Radiation Oncology | Admitting: Radiation Oncology

## 2024-02-25 ENCOUNTER — Ambulatory Visit
Admission: RE | Admit: 2024-02-25 | Discharge: 2024-02-25 | Disposition: A | Source: Ambulatory Visit | Attending: Radiation Oncology

## 2024-02-25 DIAGNOSIS — Z510 Encounter for antineoplastic radiation therapy: Secondary | ICD-10-CM | POA: Diagnosis not present

## 2024-02-25 DIAGNOSIS — C4402 Squamous cell carcinoma of skin of lip: Secondary | ICD-10-CM | POA: Diagnosis not present

## 2024-02-25 LAB — RAD ONC ARIA SESSION SUMMARY
Course Elapsed Days: 27
Plan Fractions Treated to Date: 20
Plan Prescribed Dose Per Fraction: 2.5 Gy
Plan Total Fractions Prescribed: 20
Plan Total Prescribed Dose: 50 Gy
Reference Point Dosage Given to Date: 50 Gy
Reference Point Session Dosage Given: 2.5 Gy
Session Number: 20

## 2024-02-26 NOTE — Radiation Completion Notes (Signed)
 Patient Name: Benjamin Massey, Benjamin Massey MRN: [id number] Date of Birth: March 24, 1946 Referring Physician: SAMULE LUNGER, M.D. Date of Service: 2024-02-26 Radiation Oncologist: Lauraine Golden, M.D. [facility]                             RADIATION ONCOLOGY END OF TREATMENT NOTE     Diagnosis: C44.02 Squamous cell carcinoma of skin of lip Intent: Curative     ==========DELIVERED PLANS==========  First Treatment Date: 2024-01-29 Last Treatment Date: 2024-02-25   Plan Name: HN_R_LowLip Site: Lip, Lower Technique: Electron Mode: Electron Dose Per Fraction: 2.5 Gy Prescribed Dose (Delivered / Prescribed): 50 Gy / 50 Gy Prescribed Fxs (Delivered / Prescribed): 20 / 20     ==========ON TREATMENT VISIT DATES========== 2024-02-04, 2024-02-11, 2024-02-18, 2024-02-25     ==========UPCOMING VISITS========== 03/21/2024 [facility]-CVD [facility] NEW PATIENT Jeffrie Oneil BROCKS, MD  03/18/2024 [facility]-RADIATION ONC FOLLOW UP 30 Wyatt Leeroy HERO, PA-C        ==========APPENDIX - ON TREATMENT VISIT NOTES==========   See weekly On Treatment Notes in Epic for details in the Media tab (listed as Progress notes on the On Treatment Visit Dates listed above).

## 2024-03-09 DIAGNOSIS — I10 Essential (primary) hypertension: Secondary | ICD-10-CM | POA: Diagnosis not present

## 2024-03-09 DIAGNOSIS — E785 Hyperlipidemia, unspecified: Secondary | ICD-10-CM | POA: Diagnosis not present

## 2024-03-09 DIAGNOSIS — N400 Enlarged prostate without lower urinary tract symptoms: Secondary | ICD-10-CM | POA: Diagnosis not present

## 2024-03-18 ENCOUNTER — Encounter: Payer: Self-pay | Admitting: Radiology

## 2024-03-18 ENCOUNTER — Ambulatory Visit
Admission: RE | Admit: 2024-03-18 | Discharge: 2024-03-18 | Disposition: A | Discharge status: Home / Self Care | Source: Ambulatory Visit | Attending: Radiology | Admitting: Radiology

## 2024-03-18 VITALS — BP 148/68 | HR 81 | Temp 97.8°F | Resp 20 | Ht 63.0 in | Wt 161.0 lb

## 2024-03-18 DIAGNOSIS — C4402 Squamous cell carcinoma of skin of lip: Secondary | ICD-10-CM | POA: Insufficient documentation

## 2024-03-18 HISTORY — DX: Personal history of irradiation: Z92.3

## 2024-03-18 NOTE — Progress Notes (Signed)
 Radiation Oncology         [phone] ________________________________  Name: Benjamin Massey MRN: [id number]  Date: 03/18/2024  DOB: [birth year]  Follow-Up Visit Note  CC: Dyane Anthony RAMAN, FNP  Dyane Anthony RAMAN, FNP  Diagnosis and Prior Radiotherapy:       ICD-10-CM   1. 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 He completed his treatment approximately 3 weeks ago.  Patient notes some residual swelling in his lower lip. He continues to apply Vaseline and Neosporin. He denies any pain, bleeding, scabbing or fatigue.                    ALLERGIES:  has no known allergies.  Meds: Current Outpatient Medications  Medication Sig Dispense Refill   acetaminophen  (TYLENOL ) 325 MG tablet Take 650 mg by mouth every 6 (six) hours as needed.     amLODipine (NORVASC) 10 MG tablet Take 10 mg by mouth every morning.      Cholecalciferol (VITAMIN D) 50 MCG (2000 UT) CAPS Take 1 capsule by mouth 2 (two) times daily.     COCONUT OIL PO Take 1 tablet by mouth 2 (two) times daily.     Coenzyme Q10 (CO Q-10) 100 MG CAPS Take 1 capsule by mouth daily.     Krill Oil 1000 MG CAPS Take 2 capsules by mouth 2 (two) times daily.     Lutein-Zeaxanthin (VITEYES ESSENTIALS VISION SUPP PO) Take 1 capsule by mouth daily.     Magnesium Citrate 100 MG TABS Take 1 tablet by mouth daily.      magnesium oxide (MAG-OX) 400 MG tablet Take 400 mg by mouth every morning.     Misc Natural Products (PROSTATE HEALTH PO) Take 2 capsules by mouth.     triamterene-hydrochlorothiazide (MAXZIDE-25) 37.5-25 MG per tablet Take 1 tablet by mouth every morning.      Turmeric (CURCUMIN 95 PO) Take 2 capsules by mouth daily.     No current facility-administered medications for this encounter.    Physical Findings: The patient is in no acute distress. Patient is alert and oriented. Wt Readings from Last 3 Encounters:  03/18/24 161 lb (73 kg)  01/01/24 159 lb 6.4 oz (72.3 kg)  03/06/15 165 lb (74.8 kg)    height is 5' 3 (1.6 m) and weight is 161 lb (73 kg). His temperature is 97.8 F (36.6 C). His blood pressure is 148/68 (abnormal) and his pulse is 81. His respiration is 20 and oxygen saturation is 99%. .   In general this is a well appearing male in no acute distress. she's alert and oriented x4 and appropriate throughout the examination. Cardiopulmonary assessment is negative for acute distress and she exhibits normal effort.      General: Alert and oriented, in no acute distress HEENT: Head is normocephalic. Extraocular movements are intact. Oropharynx is notable for peeling of the  inner lip, consistent with radiation changes. Neck: Neck is notable for no palpable adenopathy.  Skin: Skin in treatment fields shows satisfactory healing     Lab Findings: Lab Results  Component Value Date   WBC 18.7 (H) 05/28/2012   HGB 13.6 05/28/2012   HCT 41.1 05/28/2012   MCV 84.0 05/28/2012   PLT 446 (H) 05/28/2012    No results found for: TSH  Radiographic Findings: No results found.  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 He will continue to follow-up with his dermatologist approximately  twice per year.   Radiation follow-up PRN. We appreciate the opportunity to take part in this patient's care. He was encouraged to call with any questions or concerns.    On date of service, in total, I spent 20 minutes on this encounter. Patient was seen in person. _____________________________________    Leeroy Due, PA-C

## 2024-03-18 NOTE — Progress Notes (Signed)
 Patient identity verified x2  The patient returns today for routine follow-up to see Leeroy Due PA-C. He completed his treatment approximately 3 weeks ago.      Treatment Completion Date:  Pain issues, if any: None Using a feeding tube?: N/A Weight changes, if any:  Wt Readings from Last  4 Encounters: 03/18/24          161 lb  01/01/24 159 lb 6.4 oz (72.3 kg)  03/06/15 165 lb (74.8 kg)  08/18/14 165 lb 4.8 oz (75 kg)   Swallowing issues, if any: Denies Smoking or chewing tobacco? Denies Using fluoride toothpaste daily? Yes Last ENT visit was on: N/A Other notable issues, if any: He reports lip is still little swollen but healing well.   BP (!) 148/68 (BP Location: Left Arm, Patient Position: Sitting, Cuff Size: Large)   Pulse 81   Temp 97.8 F (36.6 C)   Resp 20   Ht 5' 3 (1.6 m)   Wt 161 lb (73 kg)   SpO2 99%   BMI 28.52 kg/m

## 2024-03-21 ENCOUNTER — Other Ambulatory Visit (HOSPITAL_BASED_OUTPATIENT_CLINIC_OR_DEPARTMENT_OTHER): Payer: Self-pay | Admitting: Cardiology

## 2024-03-21 ENCOUNTER — Encounter (HOSPITAL_BASED_OUTPATIENT_CLINIC_OR_DEPARTMENT_OTHER): Payer: Self-pay | Admitting: Cardiology

## 2024-03-21 ENCOUNTER — Ambulatory Visit (HOSPITAL_BASED_OUTPATIENT_CLINIC_OR_DEPARTMENT_OTHER): Admitting: Cardiology

## 2024-03-21 VITALS — BP 124/60 | HR 89 | Ht 63.0 in | Wt 161.8 lb

## 2024-03-21 DIAGNOSIS — E785 Hyperlipidemia, unspecified: Secondary | ICD-10-CM

## 2024-03-21 DIAGNOSIS — R931 Abnormal findings on diagnostic imaging of heart and coronary circulation: Secondary | ICD-10-CM

## 2024-03-21 DIAGNOSIS — I2510 Atherosclerotic heart disease of native coronary artery without angina pectoris: Secondary | ICD-10-CM | POA: Diagnosis not present

## 2024-03-21 DIAGNOSIS — Z01818 Encounter for other preprocedural examination: Secondary | ICD-10-CM | POA: Diagnosis not present

## 2024-03-21 NOTE — Patient Instructions (Addendum)
 Medication Instructions:   Your physician recommends that you continue on your current medications as directed. Please refer to the Current Medication list given to you today.   *If you need a refill on your cardiac medications before your next appointment, please call your pharmacy*  Lab Work:  None ordered.  If you have labs (blood work) drawn today and your tests are completely normal, you will receive your results only by: MyChart Message (if you have MyChart) OR A paper copy in the mail If you have any lab test that is abnormal or we need to change your treatment, we will call you to review the results.  Testing/Procedures:  You are scheduled for a Myocardial Perfusion Imaging Study on   Monday, December 15  at  1:00 am    .   Please arrive 15 minutes prior to your appointment time for registration and insurance purposes.   The test will take approximately 3 to 4 hours to complete; you may bring reading material. If someone comes with you to your appointment, they will need to remain in the main lobby due to limited space in the testing area.    How to prepare for your Myocardial Perfusion test:   Do not eat or drink 3 hours prior to your test, except you may have water.    Do not consume products containing caffeine (regular or decaffeinated) 12 hours prior to your test (ex: coffee, chocolate, soda, tea)   Do bring a list of your current medications with you. If not listed below, you may take your medications as normal.   Bring any held medication to your appointment, as you may be required to take it once the test is complete.   Do wear comfortable clothes (no  overalls) and walking shoes. Tennis shoes are preferred. No open toed shoes.  Do not wear cologne, aftershave or lotions (deodorant is allowed).   If these instructions are not followed, you test will have to be rescheduled.   Please report to [address] for your test. If you have questions  or concerns about your appointment, please call the Nuclear Lab at #[phone].  If you cannot keep your appointment, please provide 24 hour notification to the Nuclear lab to avoid a possible $50 charge to your account.      Follow-Up: At [facility], you and your health needs are our priority.  As part of our continuing mission to provide you with exceptional heart care, our providers are all part of one team.  This team includes your primary Cardiologist (physician) and Advanced Practice Providers or APPs (Physician Assistants and Nurse Practitioners) who all work together to provide you with the care you need, when you need it.  Your next appointment:   As needed   Provider:   Oneil Parchment, MD    We recommend signing up for the patient portal called MyChart.  Sign up information is provided on this After Visit Summary.  MyChart is used to connect with patients for Virtual Visits (Telemedicine).  Patients are able to view lab/test results, encounter notes, upcoming appointments, etc.  Non-urgent messages can be sent to your provider as well.   To learn more about what you can do with MyChart, go to [email or web address].

## 2024-03-21 NOTE — Progress Notes (Signed)
 Cardiology Office Note:  .   Date:  03/21/2024  ID:  Benjamin Massey, DOB [birth year], MRN [id number] PCP: Dyane Anthony RAMAN, FNP  [facility] Providers Cardiologist:  None    History of Present Illness: .   Benjamin Massey is a 78 y.o. male Discussed the use of AI scribe software for clinical note transcription with the patient, who gave verbal consent to proceed.  History of Present Illness 78 year old here for preoperative risk assessment prior to orthopedic surgery.  No prior cardiac history.  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  He states that he is stable from this.  He underwent a coronary calcium score on 05/17/2023-score 138 which was 39 percentile with mild aortic valve calcification and aortic atherosclerosis.  Personally reviewed.  He has treated hypertension with triamterene hydrochlorothiazide and amlodipine.  He does not take a statin.  He does take Krill oil.    ROS: Denies any fevers chills nausea vomiting syncope bleeding.  He does ambulate with a cane.  Unable to achieve greater than 4 METS of activity.  Studies Reviewed: SABRA   EKG Interpretation Date/Time:  Friday March 21 2024 14:36:26 EST Ventricular Rate:  89 PR Interval:  156 QRS Duration:  98 QT Interval:  370 QTC Calculation: 450 R Axis:   -57  Text Interpretation: Normal sinus rhythm Left anterior fascicular block Left ventricular hypertrophy ( R in aVL , Cornell product , Romhilt-Estes ) Cannot rule out Septal infarct , age undetermined When compared with ECG of 27-May-2012 10:33, Vent. rate has increased BY  30 BPM Left anterior fascicular block is now Present Poor R wave progression Confirmed by Benjamin Massey ([id number]) on 03/21/2024 3:47:25 PM    Results  Risk Assessment/Calculations:            Physical Exam:   VS:  BP 124/60 (BP Location: Left Arm, Patient Position: Sitting, Cuff Size: Normal)   Pulse 89   Ht 5' 3 (1.6 m)   Wt 161  lb 12.8 oz (73.4 kg)   SpO2 98%   BMI 28.66 kg/m    Wt Readings from Last 3 Encounters:  03/21/24 161 lb 12.8 oz (73.4 kg)  03/18/24 161 lb (73 kg)  01/01/24 159 lb 6.4 oz (72.3 kg)    GEN: Well nourished, well developed in no acute distress NECK: No JVD; No carotid bruits CARDIAC: RRR, no murmurs, no rubs, no gallops RESPIRATORY:  Clear to auscultation without rales, wheezing or rhonchi  ABDOMEN: Soft, non-tender, non-distended EXTREMITIES:  No edema; No deformity   ASSESSMENT AND PLAN: .    Assessment and Plan Assessment & Plan  78 year old here for preoperative hip evaluation - We will proceed with pharmacologic nuclear stress test to ensure that he is not have any high risk ischemia especially in the setting of coronary artery calcification present.  His EKG today also shows poor R wave progression and left anterior fascicular block. If low risk stress test, proceed with hip replacement  CAD  - consider use of Crestor, statin, for further plaque stabilization.   HTN  - well controlled. Medications continued without change.       Informed Consent   Shared Decision Making/Informed Consent The risks [chest pain, shortness of breath, cardiac arrhythmias, dizziness, blood pressure fluctuations, myocardial infarction, stroke/transient ischemic attack, nausea, vomiting, allergic reaction, radiation exposure, metallic taste sensation and life-threatening complications (estimated to be 1 in 10,000)], benefits (risk stratification, diagnosing coronary artery  disease, treatment guidance) and alternatives of a nuclear stress test were discussed in detail with Benjamin Massey and he agrees to proceed.     Dispo: PRN  Signed, Oneil Parchment, MD

## 2024-03-24 ENCOUNTER — Inpatient Hospital Stay (HOSPITAL_COMMUNITY)
Admission: RE | Admit: 2024-03-24 | Discharge: 2024-03-24 | Disposition: A | Source: Ambulatory Visit | Attending: Cardiology

## 2024-03-24 DIAGNOSIS — Z01818 Encounter for other preprocedural examination: Secondary | ICD-10-CM

## 2024-03-24 DIAGNOSIS — E785 Hyperlipidemia, unspecified: Secondary | ICD-10-CM | POA: Diagnosis present

## 2024-03-24 DIAGNOSIS — R931 Abnormal findings on diagnostic imaging of heart and coronary circulation: Secondary | ICD-10-CM

## 2024-03-24 DIAGNOSIS — Z01810 Encounter for preprocedural cardiovascular examination: Secondary | ICD-10-CM | POA: Diagnosis present

## 2024-03-24 LAB — MYOCARDIAL PERFUSION IMAGING
LV dias vol: 101 mL (ref 62–150)
LV sys vol: 34 mL (ref 4.2–5.8)
Nuc Stress EF: 66 %
Peak HR: 101 {beats}/min
Rest HR: 78 {beats}/min
Rest Nuclear Isotope Dose: 10.6 mCi
SDS: 0
SRS: 6
SSS: 1
ST Depression (mm): 0 mm
Stress Nuclear Isotope Dose: 32.8 mCi
TID: 1

## 2024-03-24 MED ORDER — TECHNETIUM TC 99M TETROFOSMIN IV KIT
32.8000 | PACK | Freq: Once | INTRAVENOUS | Status: AC | PRN
  Administered 2024-03-24: 14:00:00 32.8 via INTRAVENOUS

## 2024-03-24 MED ORDER — REGADENOSON 0.4 MG/5ML IV SOLN
INTRAVENOUS | Status: AC
  Filled 2024-03-24: qty 5

## 2024-03-24 MED ORDER — TECHNETIUM TC 99M TETROFOSMIN IV KIT
10.6000 | PACK | Freq: Once | INTRAVENOUS | Status: AC | PRN
  Administered 2024-03-24: 13:00:00 10.6 via INTRAVENOUS

## 2024-03-24 MED ORDER — REGADENOSON 0.4 MG/5ML IV SOLN
0.4000 mg | Freq: Once | INTRAVENOUS | Status: AC
  Administered 2024-03-24: 14:00:00 0.4 mg via INTRAVENOUS

## 2024-03-26 ENCOUNTER — Ambulatory Visit: Payer: Self-pay | Admitting: Cardiology

## 2024-04-01 ENCOUNTER — Telehealth: Payer: Self-pay

## 2024-04-01 ENCOUNTER — Telehealth (HOSPITAL_BASED_OUTPATIENT_CLINIC_OR_DEPARTMENT_OTHER): Payer: Self-pay | Admitting: *Deleted

## 2024-04-01 NOTE — Telephone Encounter (Signed)
"  ° °  Pre-operative Risk Assessment    Patient Name: Benjamin Massey  DOB: 17-Feb-1946 MRN: [id number]   Date of last office visit: 03/21/24 Dr. Jeffrie Date of next office visit: None   Request for Surgical Clearance    Procedure:  Left Total Hip Arthroplasy  Date of Surgery:  Clearance 04/29/24                                Surgeon:  Dr. EMERSON Car Surgeon's Group or Practice Name:  [facility] Phone number:  [phone] Fax number:  [phone]   Type of Clearance Requested:   - Medical    Type of Anesthesia:  Spinal   Additional requests/questions:    SignedSharlet Hint   04/01/2024, 10:46 AM   "

## 2024-04-01 NOTE — Telephone Encounter (Signed)
"  ° °  Patient Name: Benjamin Massey  DOB: 1945/05/25 MRN: [id number]  Primary Cardiologist: None  Per Dr. Jeffrie:  Low risk, normal stress test. Normal pump function. Coronary calcification noted. He may proceed with hip surgery with low overall cardiac risk.  Chart reviewed as part of pre-operative protocol coverage. Given past medical history and time since last visit, based on ACC/AHA guidelines, Benjamin Massey is at acceptable risk for the planned procedure without further cardiovascular testing.   I will route this recommendation to the requesting party via Epic fax function and remove from pre-op pool.  Please call with questions.  Nini Cavan E Prisila Dlouhy, NP 04/01/2024, 11:48 AM  "

## 2024-04-01 NOTE — Telephone Encounter (Signed)
 See notes below in regard to needing a cardiac preop clearance form for the pt.  I called Dr. Lyndle surgery scheduler Joen Sic, though she is out of the office and Katheryn Dustman covering for her. Left message for Katheryn Dustman that we will need a preop request STAT today to be faxed to [phone] attn: preop team.    All Conversations: Message about your results (Newest Message First)            Lucien Orren SAILOR, PA-C to Cv Div Preop Callback     04/01/24  9:16 AM He will need an official clearance request sent from his surgeon. Can you please call either patient or surgeon to get this submitted?   Thanks! Orren SAILOR Lucien, PA-C Theotis Sharlet PARAS, RN to Lucien Orren SAILOR, [place]     03/31/24  5:41 PM Pt was seen at [facility].  The only thing I can see in the chart is Dr Ernie has a planned procedure scheduled for him.  I do not see a request for clearance anywhere.   Thanks     Velinda Avelina CROME, RN    03/27/24  5:40 PM Result Note Patient viewed result comments in my chart Myocardial Perfusion Imaging Lucien Orren SAILOR, PA-C to Theotis Sharlet PARAS, RN     03/26/24  7:16 AM Walterine Lango,    Let me know if I need to fax this over or if its already been done, thanks!   Orren SAILOR Lucien, PA-C Jeffrie Oneil BROCKS, MD to Dyane Anthony RAMAN, FNP  Theotis Sharlet PARAS, RN  Cv Div Preop    03/26/24  6:27 AM Result Note Low risk, normal stress test.  Normal pump function.  Coronary calcification noted.  He may proceed with hip surgery with low overall cardiac risk.  Consider statin use in the future, Crestor 10 mg once a day for instance, in the setting of his coronary calcification. Myocardial Perfusion Imaging Jeffrie Oneil BROCKS, MD to Starleen BROCKS Evangelist Al     03/26/24  6:27 AM Low risk, normal stress test.  Normal pump function.  Coronary calcification noted.  He may proceed with hip surgery with low overall cardiac risk.  Consider statin use in the future, Crestor 10 mg once a day for instance, in the setting  of his coronary calcification.  Last read by Starleen BROCKS Evangelist Daria at 7:07PM on 03/26/2024. Myocardial Perfusion Imaging This encounter is not signed. The conversation may still be ongoing.

## 2024-04-01 NOTE — Telephone Encounter (Signed)
 SEE CLEARANCE NOTES SENT TO PREOP

## 2024-04-17 NOTE — Progress Notes (Signed)
 Anesthesia Review:  PCP: Cardiologist : [name] LOV 03/21/24   PPM/ ICD: Device Orders: Rep Notified:  Chest x-ray : EKG : 03/21/24  CT Card-05/31/23  Echo : Stress test: 03/24/24  Cardiac Cath :   Activity level:  Sleep Study/ CPAP : Fasting Blood Sugar :      / Checks Blood Sugar -- times a day:    Blood Thinner/ Instructions /Last Dose: ASA / Instructions/ Last Dose :

## 2024-04-17 NOTE — Patient Instructions (Signed)
 SURGICAL WAITING ROOM VISITATION  Patients having surgery or a procedure may have no more than 2 support people in the waiting area - these visitors may rotate.    Children ages 82 and under will not be able to visit patients in [facility] under most circumstances.   Visitors with respiratory illnesses are discouraged from visiting and should remain at home.  If the patient needs to stay at the hospital during part of their recovery, the visitor guidelines for inpatient rooms apply. Pre-op nurse will coordinate an appropriate time for 1 support person to accompany patient in pre-op.  This support person may not rotate.    Please refer to the [facility] website for the visitor guidelines for Inpatients (after your surgery is over and you are in a regular room).       Your procedure is scheduled on:  04/29/2024    Report to [facility] Main Entrance    Report to admitting at  0610 AM   Call this number if you have problems the morning of surgery [phone]   Do not eat food :After Midnight.   After Midnight you may have the following liquids until 0530______ AM DAY OF SURGERY  Water Non-Citrus Juices (without pulp, NO RED-Apple, White grape, White cranberry) Black Coffee (NO MILK/CREAM OR CREAMERS, sugar ok)  Clear Tea (NO MILK/CREAM OR CREAMERS, sugar ok) regular and decaf                             Plain Jell-O (NO RED)                                           Fruit ices (not with fruit pulp, NO RED)                                     Popsicles (NO RED)                                                               Sports drinks like Gatorade (NO RED)                    The day of surgery:  Drink ONE (1) Pre-Surgery Clear Ensure or G2 at  0530AM the morning of surgery. Drink in one sitting. Do not sip.  This drink was given to you during your hospital  pre-op appointment visit. Nothing else to drink after completing the  Pre-Surgery Clear Ensure or  G2.          If you have questions, please contact your surgeons office.      Oral Hygiene is also important to reduce your risk of infection.                                    Remember - BRUSH YOUR TEETH THE MORNING OF SURGERY WITH YOUR REGULAR TOOTHPASTE  DENTURES WILL BE REMOVED PRIOR TO SURGERY PLEASE DO NOT APPLY Poly grip OR  ADHESIVES!!!   Do NOT smoke after Midnight   Stop all vitamins and herbal supplements 7 days before surgery.   Take these medicines the morning of surgery with A SIP OF WATER:  amlodipine, magnesium   DO NOT TAKE ANY ORAL DIABETIC MEDICATIONS DAY OF YOUR SURGERY  Bring CPAP mask and tubing day of surgery.                              You may not have any metal on your body including hair pins, jewelry, and body piercing             Do not wear make-up, lotions, powders, perfumes/cologne, or deodorant  Do not wear nail polish including gel and S&S, artificial/acrylic nails, or any other type of covering on natural nails including finger and toenails. If you have artificial nails, gel coating, etc. that needs to be removed by a nail salon please have this removed prior to surgery or surgery may need to be canceled/ delayed if the surgeon/ anesthesia feels like they are unable to be safely monitored.   Do not shave  48 hours prior to surgery.               Men may shave face and neck.   Do not bring valuables to the hospital. [facility] IS NOT             RESPONSIBLE   FOR VALUABLES.   Contacts, glasses, dentures or bridgework may not be worn into surgery.   Bring small overnight bag day of surgery.   DO NOT BRING YOUR HOME MEDICATIONS TO THE HOSPITAL. PHARMACY WILL DISPENSE MEDICATIONS LISTED ON YOUR MEDICATION LIST TO YOU DURING YOUR ADMISSION IN THE HOSPITAL!    Patients discharged on the day of surgery will not be allowed to drive home.  Someone NEEDS to stay with you for the first 24 hours after anesthesia.   Special Instructions: Bring a  copy of your healthcare power of attorney and living will documents the day of surgery if you haven't scanned them before.              Please read over the following fact sheets you were given: IF YOU HAVE QUESTIONS ABOUT YOUR PRE-OP INSTRUCTIONS PLEASE CALL [phone].   If you received a COVID test during your pre-op visit  it is requested that you wear a mask when out in public, stay away from anyone that may not be feeling well and notify your surgeon if you develop symptoms. If you test positive for Covid or have been in contact with anyone that has tested positive in the last 10 days please notify you surgeon.      Pre-operative 4 CHG Bath Instructions   You can play a key role in reducing the risk of infection after surgery. Your skin needs to be as free of germs as possible. You can reduce the number of germs on your skin by washing with CHG (chlorhexidine  gluconate) soap before surgery. CHG is an antiseptic soap that kills germs and continues to kill germs even after washing.   DO NOT use if you have an allergy to chlorhexidine /CHG or antibacterial soaps. If your skin becomes reddened or irritated, stop using the CHG and notify one of our RNs at [phone].   Please shower with the CHG soap starting 4 days before surgery using the following schedule:     Please keep in mind the  following:  DO NOT shave, including legs and underarms, starting the day of your first shower.   You may shave your face at any point before/day of surgery.  Place clean sheets on your bed the day you start using CHG soap. Use a clean washcloth (not used since being washed) for each shower. DO NOT sleep with pets once you start using the CHG.   CHG Shower Instructions:  If you choose to wash your hair and private area, wash first with your normal shampoo/soap.  After you use shampoo/soap, rinse your hair and body thoroughly to remove shampoo/soap residue.  Turn the water OFF and apply about 3 tablespoons  (45 ml) of CHG soap to a CLEAN washcloth.  Apply CHG soap ONLY FROM YOUR NECK DOWN TO YOUR TOES (washing for 3-5 minutes)  DO NOT use CHG soap on face, private areas, open wounds, or sores.  Pay special attention to the area where your surgery is being performed.  If you are having back surgery, having someone wash your back for you may be helpful. Wait 2 minutes after CHG soap is applied, then you may rinse off the CHG soap.  Pat dry with a clean towel  Put on clean clothes/pajamas   If you choose to wear lotion, please use ONLY the CHG-compatible lotions on the back of this paper.     Additional instructions for the day of surgery: DO NOT APPLY any lotions, deodorants, cologne, or perfumes.   Put on clean/comfortable clothes.  Brush your teeth.  Ask your nurse before applying any prescription medications to the skin.      CHG Compatible Lotions   Aveeno Moisturizing lotion  Cetaphil Moisturizing Cream  Cetaphil Moisturizing Lotion  Clairol Herbal Essence Moisturizing Lotion, Dry Skin  Clairol Herbal Essence Moisturizing Lotion, Extra Dry Skin  Clairol Herbal Essence Moisturizing Lotion, Normal Skin  Curel Age Defying Therapeutic Moisturizing Lotion with Alpha Hydroxy  Curel Extreme Care Body Lotion  Curel Soothing Hands Moisturizing Hand Lotion  Curel Therapeutic Moisturizing Cream, Fragrance-Free  Curel Therapeutic Moisturizing Lotion, Fragrance-Free  Curel Therapeutic Moisturizing Lotion, Original Formula  Eucerin Daily Replenishing Lotion  Eucerin Dry Skin Therapy Plus Alpha Hydroxy Crme  Eucerin Dry Skin Therapy Plus Alpha Hydroxy Lotion  Eucerin Original Crme  Eucerin Original Lotion  Eucerin Plus Crme Eucerin Plus Lotion  Eucerin TriLipid Replenishing Lotion  Keri Anti-Bacterial Hand Lotion  Keri Deep Conditioning Original Lotion Dry Skin Formula Softly Scented  Keri Deep Conditioning Original Lotion, Fragrance Free Sensitive Skin Formula  Keri Lotion Fast  Absorbing Fragrance Free Sensitive Skin Formula  Keri Lotion Fast Absorbing Softly Scented Dry Skin Formula  Keri Original Lotion  Keri Skin Renewal Lotion Keri Silky Smooth Lotion  Keri Silky Smooth Sensitive Skin Lotion  Nivea Body Creamy Conditioning Oil  Nivea Body Extra Enriched Teacher, Adult Education Moisturizing Lotion Nivea Crme  Nivea Skin Firming Lotion  NutraDerm 30 Skin Lotion  NutraDerm Skin Lotion  NutraDerm Therapeutic Skin Cream  NutraDerm Therapeutic Skin Lotion  ProShield Protective Hand Cream  Provon moisturizing lotion

## 2024-04-22 ENCOUNTER — Encounter (HOSPITAL_COMMUNITY): Payer: Self-pay

## 2024-04-22 ENCOUNTER — Other Ambulatory Visit: Payer: Self-pay

## 2024-04-22 ENCOUNTER — Encounter (HOSPITAL_COMMUNITY)
Admission: RE | Admit: 2024-04-22 | Discharge: 2024-04-22 | Disposition: A | Source: Ambulatory Visit | Attending: Orthopedic Surgery

## 2024-04-22 VITALS — BP 150/74 | HR 76 | Temp 98.4°F | Resp 16 | Ht 63.0 in | Wt 152.0 lb

## 2024-04-22 DIAGNOSIS — Z905 Acquired absence of kidney: Secondary | ICD-10-CM | POA: Insufficient documentation

## 2024-04-22 DIAGNOSIS — Z86711 Personal history of pulmonary embolism: Secondary | ICD-10-CM | POA: Diagnosis not present

## 2024-04-22 DIAGNOSIS — I2510 Atherosclerotic heart disease of native coronary artery without angina pectoris: Secondary | ICD-10-CM | POA: Diagnosis not present

## 2024-04-22 DIAGNOSIS — Z01818 Encounter for other preprocedural examination: Secondary | ICD-10-CM | POA: Diagnosis present

## 2024-04-22 DIAGNOSIS — Z01812 Encounter for preprocedural laboratory examination: Secondary | ICD-10-CM | POA: Insufficient documentation

## 2024-04-22 DIAGNOSIS — I10 Essential (primary) hypertension: Secondary | ICD-10-CM | POA: Insufficient documentation

## 2024-04-22 DIAGNOSIS — Z8546 Personal history of malignant neoplasm of prostate: Secondary | ICD-10-CM | POA: Insufficient documentation

## 2024-04-22 DIAGNOSIS — Z923 Personal history of irradiation: Secondary | ICD-10-CM | POA: Diagnosis not present

## 2024-04-22 DIAGNOSIS — M1612 Unilateral primary osteoarthritis, left hip: Secondary | ICD-10-CM | POA: Insufficient documentation

## 2024-04-22 HISTORY — DX: Prediabetes: R73.03

## 2024-04-22 HISTORY — DX: Unspecified osteoarthritis, unspecified site: M19.90

## 2024-04-22 LAB — CBC
HCT: 46.2 % (ref 39.0–52.0)
Hemoglobin: 15.5 g/dL (ref 13.0–17.0)
MCH: 29.5 pg (ref 26.0–34.0)
MCHC: 33.5 g/dL (ref 30.0–36.0)
MCV: 88 fL (ref 80.0–100.0)
Platelets: 394 K/uL (ref 150–400)
RBC: 5.25 MIL/uL (ref 4.22–5.81)
RDW: 13.3 % (ref 11.5–15.5)
WBC: 10.5 K/uL (ref 4.0–10.5)
nRBC: 0 % (ref 0.0–0.2)

## 2024-04-22 LAB — BASIC METABOLIC PANEL WITH GFR
Anion gap: 13 (ref 5–15)
BUN: 24 mg/dL — ABNORMAL HIGH (ref 8–23)
CO2: 25 mmol/L (ref 22–32)
Calcium: 10 mg/dL (ref 8.9–10.3)
Chloride: 99 mmol/L (ref 98–111)
Creatinine, Ser: 1.18 mg/dL (ref 0.61–1.24)
GFR, Estimated: >60 mL/min
Glucose, Bld: 105 mg/dL — ABNORMAL HIGH (ref 70–99)
Potassium: 3.2 mmol/L — ABNORMAL LOW (ref 3.5–5.1)
Sodium: 137 mmol/L (ref 135–145)

## 2024-04-23 ENCOUNTER — Encounter (HOSPITAL_COMMUNITY): Payer: Self-pay

## 2024-04-23 NOTE — Anesthesia Preprocedure Evaluation (Addendum)
 "                                  Anesthesia Evaluation  Patient identified by MRN, date of birth, ID band Patient awake    Reviewed: Allergy & Precautions, NPO status , Patient's Chart, lab work & pertinent test results  History of Anesthesia Complications Negative for: history of anesthetic complications  Airway Mallampati: III  TM Distance: >3 FB Neck ROM: Limited    Dental  (+) Dental Advisory Given, Teeth Intact   Pulmonary neg shortness of breath, neg sleep apnea, neg COPD, neg recent URI   breath sounds clear to auscultation       Cardiovascular hypertension, Pt. on medications (-) angina (-) Past MI and (-) CHF  Rhythm:Regular  12/125 stress:  The study is normal. The study is low risk.   LV perfusion is normal. There is no evidence of ischemia. There is no evidence of infarction.   Left ventricular function is normal. Nuclear stress EF: 66%. The left ventricular ejection fraction is hyperdynamic (>65%). End diastolic cavity size is normal.   Coronary calcium was present on the attenuation correction CT images. Minimal coronary calcifications were present. Coronary calcifications were present in the left anterior descending artery and left circumflex artery distribution(s).    Neuro/Psych neg Seizures    GI/Hepatic negative GI ROS, Neg liver ROS,,,  Endo/Other    Renal/GU Renal diseaseLab Results      Component                Value               Date                      NA                       137                 04/22/2024                K                        3.2 (L)             04/22/2024                CO2                      25                  04/22/2024                GLUCOSE                  105 (H)             04/22/2024                BUN                      24 (H)              04/22/2024                CREATININE               1.18  04/22/2024                CALCIUM                  10.0                04/22/2024                 GFRNONAA                 >60                 04/22/2024             S/p kidney resection     Musculoskeletal  (+) Arthritis ,    Abdominal   Peds  Hematology negative hematology ROS (+) Lab Results      Component                Value               Date                      WBC                      10.5                04/22/2024                HGB                      15.5                04/22/2024                HCT                      46.2                04/22/2024                MCV                      88.0                04/22/2024                PLT                      394                 04/22/2024              Anesthesia Other Findings B/l LE pitting edema in setting of diuretic being held x 1 week, equal and bilateral, no SOB, can lay flat, lungs CTAB  Reproductive/Obstetrics                              Anesthesia Physical Anesthesia Plan  ASA: 3  Anesthesia Plan: MAC and Spinal   Post-op Pain Management:    Induction: Intravenous  PONV Risk Score and Plan: 1 and Propofol  infusion and Treatment may vary due to age or medical condition  Airway Management Planned: Nasal Cannula, Natural Airway and Simple Face Mask  Additional Equipment: None  Intra-op Plan:   Post-operative Plan:   Informed Consent: I have reviewed the patients  History and Physical, chart, labs and discussed the procedure including the risks, benefits and alternatives for the proposed anesthesia with the patient or authorized representative who has indicated his/her understanding and acceptance.     Dental advisory given  Plan Discussed with: CRNA and Surgeon  Anesthesia Plan Comments: (See PAT note from 1/13  Foley with genital diureses intra-op. Restart home diuretic post op)         Anesthesia Quick Evaluation  "

## 2024-04-23 NOTE — Progress Notes (Signed)
 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  Patient seen by Cardiology on 03/21/24 for pre op evaluation due to coronary calcifications. A stress test was done on 03/24/24 and was normal/low risk. Pt cleared for upcoming surgery:  Low risk, normal stress test. Normal pump function. Coronary calcification noted. He may proceed with hip surgery with low overall cardiac risk.  VS: BP (!) 150/74   Pulse 76   Temp 36.9 C (Oral)   Resp 16   Ht 5' 3 (1.6 m)   Wt 68.9 kg   SpO2 99%   BMI 26.93 kg/m   PROVIDERS: Dyane Anthony RAMAN, FNP   LABS: Labs reviewed: Acceptable for surgery. (all labs ordered are listed, but only abnormal results are displayed)  Labs Reviewed  BASIC METABOLIC PANEL WITH GFR - Abnormal; Notable for the following components:      Result Value   Potassium 3.2 (*)    Glucose, Bld 105 (*)    BUN 24 (*)    All other components within normal limits  CBC  TYPE AND SCREEN     CT Chest overread 03/24/24:  IMPRESSION: 1. No acute extracardiac findings. 2. Aortic atherosclerosis.   Aortic Atherosclerosis (ICD10-I70.0).  EKG 03/21/2024:  Normal sinus rhythm Left anterior fascicular block Left ventricular hypertrophy Cannot rule out septal infarct, age undetermined   Lexiscan  stress test 03/24/24:    The study is normal. The study is low risk.   LV perfusion is normal. There is no evidence of ischemia. There is no evidence of infarction.   Left ventricular function is normal. Nuclear stress EF: 66%. The  left ventricular ejection fraction is hyperdynamic (>65%). End diastolic cavity size is normal.   Coronary calcium was present on the attenuation correction CT images. Minimal coronary calcifications were present. Coronary calcifications were present in the left anterior descending artery and left circumflex artery distribution(s). Past Medical History:  Diagnosis Date   Acute urinary retention    Arthritis    Bladder stone    Foley catheter in place    History of kidney stones    History of pulmonary embolus (PE) 04/10/2000   BILATERAL--  TX W/ COUMADIN   History of radiation therapy    01/29/24 - 02/25/24 Lip- Dr. 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;  Service: Urology;  Laterality: N/A;   URETEROSCOPIC STONE EXTRACTION W/ STENT PLACEMENT  04/10/2000    MEDICATIONS:  acetaminophen  (TYLENOL ) 325 MG tablet   amLODipine (NORVASC) 10 MG tablet   Cholecalciferol (VITAMIN D) 50 MCG (2000 UT) CAPS   COCONUT OIL PO   Coenzyme Q10 (CO Q-10) 100 MG CAPS   DNA Collection Product (DYNAMIC CO)   Krill Oil 1000 MG CAPS   Lutein-Zeaxanthin (VITEYES ESSENTIALS VISION SUPP PO)   magnesium oxide (MAG-OX) 400 MG tablet   Misc Natural Products (PROSTATE HEALTH PO)   triamterene-hydrochlorothiazide  (MAXZIDE-25) 37.5-25 MG per tablet   Turmeric (CURCUMIN 95 PO)   No current facility-administered medications for this encounter.   Benjamin [facility] Surgical Short Stay/Anesthesiology Benjamin [facility]sey Phone [phone] 04/23/2024 11:46 AM        "

## 2024-04-29 ENCOUNTER — Ambulatory Visit (HOSPITAL_COMMUNITY)

## 2024-04-29 ENCOUNTER — Encounter (HOSPITAL_COMMUNITY): Admission: RE | Disposition: A | Payer: Self-pay | Source: Ambulatory Visit | Attending: Orthopedic Surgery

## 2024-04-29 ENCOUNTER — Ambulatory Visit (HOSPITAL_COMMUNITY): Payer: Self-pay | Admitting: Medical

## 2024-04-29 ENCOUNTER — Other Ambulatory Visit: Payer: Self-pay

## 2024-04-29 ENCOUNTER — Ambulatory Visit (HOSPITAL_COMMUNITY): Admitting: Anesthesiology

## 2024-04-29 ENCOUNTER — Encounter (HOSPITAL_COMMUNITY): Payer: Self-pay | Admitting: Orthopedic Surgery

## 2024-04-29 ENCOUNTER — Observation Stay (HOSPITAL_COMMUNITY)
Admission: RE | Admit: 2024-04-29 | Discharge: 2024-04-30 | Disposition: A | Discharge status: Home / Self Care | Source: Ambulatory Visit | Attending: Orthopedic Surgery | Admitting: Orthopedic Surgery

## 2024-04-29 DIAGNOSIS — Z8546 Personal history of malignant neoplasm of prostate: Secondary | ICD-10-CM | POA: Diagnosis not present

## 2024-04-29 DIAGNOSIS — I10 Essential (primary) hypertension: Secondary | ICD-10-CM

## 2024-04-29 DIAGNOSIS — Z96642 Presence of left artificial hip joint: Secondary | ICD-10-CM

## 2024-04-29 DIAGNOSIS — Z85828 Personal history of other malignant neoplasm of skin: Secondary | ICD-10-CM | POA: Insufficient documentation

## 2024-04-29 DIAGNOSIS — M1612 Unilateral primary osteoarthritis, left hip: Secondary | ICD-10-CM | POA: Diagnosis not present

## 2024-04-29 DIAGNOSIS — M25552 Pain in left hip: Secondary | ICD-10-CM | POA: Diagnosis present

## 2024-04-29 DIAGNOSIS — Z01818 Encounter for other preprocedural examination: Secondary | ICD-10-CM

## 2024-04-29 DIAGNOSIS — Z79899 Other long term (current) drug therapy: Secondary | ICD-10-CM | POA: Insufficient documentation

## 2024-04-29 DIAGNOSIS — Z7901 Long term (current) use of anticoagulants: Secondary | ICD-10-CM | POA: Diagnosis not present

## 2024-04-29 HISTORY — PX: TOTAL HIP ARTHROPLASTY: SHX124

## 2024-04-29 LAB — TYPE AND SCREEN
ABO/RH(D): AB POS
Antibody Screen: NEGATIVE

## 2024-04-29 LAB — ABO/RH: ABO/RH(D): AB POS

## 2024-04-29 MED ORDER — ACETAMINOPHEN 10 MG/ML IV SOLN
INTRAVENOUS | Status: AC
  Filled 2024-04-29: qty 100

## 2024-04-29 MED ORDER — CEFAZOLIN SODIUM-DEXTROSE 2-4 GM/100ML-% IV SOLN
2.0000 g | INTRAVENOUS | Status: AC
  Administered 2024-04-29: 2 g via INTRAVENOUS

## 2024-04-29 MED ORDER — PROPOFOL 500 MG/50ML IV EMUL
INTRAVENOUS | Status: DC | PRN
  Administered 2024-04-29: 35 ug/kg/min via INTRAVENOUS

## 2024-04-29 MED ORDER — CEFAZOLIN SODIUM-DEXTROSE 2-4 GM/100ML-% IV SOLN
2.0000 g | Freq: Four times a day (QID) | INTRAVENOUS | Status: AC
  Administered 2024-04-29 (×2): 2 g via INTRAVENOUS
  Filled 2024-04-29 (×2): qty 100

## 2024-04-29 MED ORDER — ACETAMINOPHEN 500 MG PO TABS
1000.0000 mg | ORAL_TABLET | Freq: Four times a day (QID) | ORAL | Status: DC
  Administered 2024-04-29 – 2024-04-30 (×3): 1000 mg via ORAL
  Filled 2024-04-29 (×3): qty 2

## 2024-04-29 MED ORDER — BUPIVACAINE IN DEXTROSE 0.75-8.25 % IT SOLN
INTRATHECAL | Status: DC | PRN
  Administered 2024-04-29: 1.6 mL via INTRATHECAL

## 2024-04-29 MED ORDER — ACETAMINOPHEN 10 MG/ML IV SOLN
INTRAVENOUS | Status: DC | PRN
  Administered 2024-04-29: 1000 mg via INTRAVENOUS

## 2024-04-29 MED ORDER — FUROSEMIDE 10 MG/ML IJ SOLN
20.0000 mg | Freq: Once | INTRAMUSCULAR | Status: AC
  Administered 2024-04-29: 20 mg via INTRAVENOUS

## 2024-04-29 MED ORDER — MENTHOL 3 MG MT LOZG: 1.0000 | LOZENGE | OROMUCOSAL | Status: DC | PRN

## 2024-04-29 MED ORDER — LACTATED RINGERS IV SOLN: INTRAVENOUS | Status: DC

## 2024-04-29 MED ORDER — KETOROLAC TROMETHAMINE 30 MG/ML IJ SOLN
INTRAMUSCULAR | Status: AC
  Filled 2024-04-29: qty 1

## 2024-04-29 MED ORDER — ONDANSETRON HCL 4 MG/2ML IJ SOLN
INTRAMUSCULAR | Status: AC
  Filled 2024-04-29: qty 2

## 2024-04-29 MED ORDER — DIPHENHYDRAMINE HCL 12.5 MG/5ML PO ELIX: 12.5000 mg | ORAL_SOLUTION | ORAL | Status: DC | PRN

## 2024-04-29 MED ORDER — APIXABAN 2.5 MG PO TABS
2.5000 mg | ORAL_TABLET | Freq: Two times a day (BID) | ORAL | Status: DC
  Administered 2024-04-30: 2.5 mg via ORAL
  Filled 2024-04-29: qty 1

## 2024-04-29 MED ORDER — DEXAMETHASONE SOD PHOSPHATE PF 10 MG/ML IJ SOLN
INTRAMUSCULAR | Status: AC
  Filled 2024-04-29: qty 1

## 2024-04-29 MED ORDER — DEXAMETHASONE SOD PHOSPHATE PF 10 MG/ML IJ SOLN
10.0000 mg | Freq: Once | INTRAMUSCULAR | Status: AC
  Administered 2024-04-30: 10 mg via INTRAVENOUS
  Filled 2024-04-29: qty 1

## 2024-04-29 MED ORDER — SENNA 8.6 MG PO TABS
2.0000 | ORAL_TABLET | Freq: Every day | ORAL | Status: DC
  Administered 2024-04-29: 17.2 mg via ORAL
  Filled 2024-04-29: qty 2

## 2024-04-29 MED ORDER — BUPIVACAINE-EPINEPHRINE (PF) 0.25% -1:200000 IJ SOLN
INTRAMUSCULAR | Status: AC
  Filled 2024-04-29: qty 30

## 2024-04-29 MED ORDER — ONDANSETRON HCL 4 MG/2ML IJ SOLN: 4.0000 mg | Freq: Four times a day (QID) | INTRAMUSCULAR | Status: DC | PRN

## 2024-04-29 MED ORDER — SODIUM CHLORIDE 0.9 % IV SOLN: INTRAVENOUS | Status: DC

## 2024-04-29 MED ORDER — PROPOFOL 1000 MG/100ML IV EMUL
INTRAVENOUS | Status: AC
  Filled 2024-04-29: qty 100

## 2024-04-29 MED ORDER — 0.9 % SODIUM CHLORIDE (POUR BTL) OPTIME
TOPICAL | Status: DC | PRN
  Administered 2024-04-29: 1000 mL

## 2024-04-29 MED ORDER — FENTANYL CITRATE (PF) 100 MCG/2ML IJ SOLN
INTRAMUSCULAR | Status: AC
  Filled 2024-04-29: qty 2

## 2024-04-29 MED ORDER — PHENYLEPHRINE HCL-NACL 20-0.9 MG/250ML-% IV SOLN
INTRAVENOUS | Status: DC | PRN
  Administered 2024-04-29 (×2): 50 ug/min via INTRAVENOUS

## 2024-04-29 MED ORDER — BISACODYL 10 MG RE SUPP: 10.0000 mg | Freq: Every day | RECTAL | Status: DC | PRN

## 2024-04-29 MED ORDER — ONDANSETRON HCL 4 MG/2ML IJ SOLN
INTRAMUSCULAR | Status: DC | PRN
  Administered 2024-04-29: 4 mg via INTRAVENOUS

## 2024-04-29 MED ORDER — ORAL CARE MOUTH RINSE: 15.0000 mL | Freq: Once | OROMUCOSAL | Status: AC

## 2024-04-29 MED ORDER — POVIDONE-IODINE 10 % EX SWAB: 2.0000 | Freq: Once | CUTANEOUS | Status: DC

## 2024-04-29 MED ORDER — ONDANSETRON HCL 4 MG PO TABS: 4.0000 mg | ORAL_TABLET | Freq: Four times a day (QID) | ORAL | Status: DC | PRN

## 2024-04-29 MED ORDER — METOCLOPRAMIDE HCL 5 MG PO TABS: 5.0000 mg | ORAL_TABLET | Freq: Three times a day (TID) | ORAL | Status: DC | PRN

## 2024-04-29 MED ORDER — ALBUMIN HUMAN 5 % IV SOLN: INTRAVENOUS | Status: DC | PRN

## 2024-04-29 MED ORDER — POLYETHYLENE GLYCOL 3350 17 G PO PACK
17.0000 g | PACK | Freq: Two times a day (BID) | ORAL | Status: DC
  Administered 2024-04-29 – 2024-04-30 (×2): 17 g via ORAL
  Filled 2024-04-29 (×2): qty 1

## 2024-04-29 MED ORDER — FENTANYL CITRATE (PF) 100 MCG/2ML IJ SOLN
INTRAMUSCULAR | Status: DC | PRN
  Administered 2024-04-29: 100 ug via INTRAVENOUS

## 2024-04-29 MED ORDER — TRANEXAMIC ACID-NACL 1000-0.7 MG/100ML-% IV SOLN
1000.0000 mg | INTRAVENOUS | Status: AC
  Administered 2024-04-29: 1000 mg via INTRAVENOUS

## 2024-04-29 MED ORDER — SODIUM CHLORIDE (PF) 0.9 % IJ SOLN
INTRAMUSCULAR | Status: AC
  Filled 2024-04-29: qty 50

## 2024-04-29 MED ORDER — OXYCODONE HCL 5 MG PO TABS
5.0000 mg | ORAL_TABLET | ORAL | Status: DC | PRN
  Administered 2024-04-29: 5 mg via ORAL
  Filled 2024-04-29: qty 1
  Filled 2024-04-29: qty 2

## 2024-04-29 MED ORDER — DEXAMETHASONE SOD PHOSPHATE PF 10 MG/ML IJ SOLN: 8.0000 mg | Freq: Once | INTRAMUSCULAR | Status: DC

## 2024-04-29 MED ORDER — METHOCARBAMOL 500 MG PO TABS
500.0000 mg | ORAL_TABLET | Freq: Four times a day (QID) | ORAL | Status: DC | PRN
  Administered 2024-04-29 – 2024-04-30 (×2): 500 mg via ORAL
  Filled 2024-04-29 (×2): qty 1

## 2024-04-29 MED ORDER — METOCLOPRAMIDE HCL 5 MG/ML IJ SOLN: 5.0000 mg | Freq: Three times a day (TID) | INTRAMUSCULAR | Status: DC | PRN

## 2024-04-29 MED ORDER — OXYCODONE HCL 5 MG PO TABS: 5.0000 mg | ORAL_TABLET | Freq: Once | ORAL | Status: DC | PRN

## 2024-04-29 MED ORDER — PHENOL 1.4 % MT LIQD: 1.0000 | OROMUCOSAL | Status: DC | PRN

## 2024-04-29 MED ORDER — FUROSEMIDE 10 MG/ML IJ SOLN
INTRAMUSCULAR | Status: AC
  Filled 2024-04-29: qty 2

## 2024-04-29 MED ORDER — PHENYLEPHRINE 80 MCG/ML (10ML) SYRINGE FOR IV PUSH (FOR BLOOD PRESSURE SUPPORT): PREFILLED_SYRINGE | INTRAVENOUS | Status: DC | PRN

## 2024-04-29 MED ORDER — DEXAMETHASONE SOD PHOSPHATE PF 10 MG/ML IJ SOLN
INTRAMUSCULAR | Status: DC | PRN
  Administered 2024-04-29: 8 mg via INTRAVENOUS

## 2024-04-29 MED ORDER — TRANEXAMIC ACID-NACL 1000-0.7 MG/100ML-% IV SOLN
1000.0000 mg | Freq: Once | INTRAVENOUS | Status: AC
  Administered 2024-04-29: 1000 mg via INTRAVENOUS
  Filled 2024-04-29: qty 100

## 2024-04-29 MED ORDER — TRANEXAMIC ACID-NACL 1000-0.7 MG/100ML-% IV SOLN
INTRAVENOUS | Status: AC
  Filled 2024-04-29: qty 100

## 2024-04-29 MED ORDER — PROPOFOL 10 MG/ML IV BOLUS
INTRAVENOUS | Status: DC | PRN
  Administered 2024-04-29 (×3): 20 mg via INTRAVENOUS

## 2024-04-29 MED ORDER — SODIUM CHLORIDE (PF) 0.9 % IJ SOLN
INTRAMUSCULAR | Status: DC | PRN
  Administered 2024-04-29: 61 mL

## 2024-04-29 MED ORDER — ALBUMIN HUMAN 5 % IV SOLN
INTRAVENOUS | Status: AC
  Filled 2024-04-29: qty 250

## 2024-04-29 MED ORDER — AMLODIPINE BESYLATE 10 MG PO TABS
10.0000 mg | ORAL_TABLET | Freq: Every morning | ORAL | Status: DC
  Administered 2024-04-30: 10 mg via ORAL
  Filled 2024-04-29: qty 1

## 2024-04-29 MED ORDER — ALUM & MAG HYDROXIDE-SIMETH 200-200-20 MG/5ML PO SUSP: 30.0000 mL | ORAL | Status: DC | PRN

## 2024-04-29 MED ORDER — CEFAZOLIN SODIUM-DEXTROSE 2-4 GM/100ML-% IV SOLN
INTRAVENOUS | Status: AC
  Filled 2024-04-29: qty 100

## 2024-04-29 MED ORDER — HYDROMORPHONE HCL 1 MG/ML IJ SOLN: 0.5000 mg | INTRAMUSCULAR | Status: DC | PRN

## 2024-04-29 MED ORDER — FENTANYL CITRATE (PF) 50 MCG/ML IJ SOSY: 25.0000 ug | PREFILLED_SYRINGE | INTRAMUSCULAR | Status: DC | PRN

## 2024-04-29 MED ORDER — ACETAMINOPHEN 10 MG/ML IV SOLN: 1000.0000 mg | Freq: Once | INTRAVENOUS | Status: DC | PRN

## 2024-04-29 MED ORDER — OXYCODONE HCL 5 MG/5ML PO SOLN: 5.0000 mg | Freq: Once | ORAL | Status: DC | PRN

## 2024-04-29 MED ORDER — TRAMADOL HCL 50 MG PO TABS
50.0000 mg | ORAL_TABLET | Freq: Four times a day (QID) | ORAL | Status: DC | PRN
  Administered 2024-04-29: 50 mg via ORAL
  Filled 2024-04-29: qty 1

## 2024-04-29 MED ORDER — METHOCARBAMOL 1000 MG/10ML IJ SOLN: 500.0000 mg | Freq: Four times a day (QID) | INTRAMUSCULAR | Status: DC | PRN

## 2024-04-29 MED ORDER — CHLORHEXIDINE GLUCONATE 0.12 % MT SOLN
15.0000 mL | Freq: Once | OROMUCOSAL | Status: AC
  Administered 2024-04-29: 15 mL via OROMUCOSAL

## 2024-04-29 MED ORDER — TRIAMTERENE-HCTZ 37.5-25 MG PO TABS
1.0000 | ORAL_TABLET | Freq: Every morning | ORAL | Status: DC
  Administered 2024-04-30: 1 via ORAL
  Filled 2024-04-29: qty 1

## 2024-04-29 MED ORDER — PROPOFOL 10 MG/ML IV BOLUS
INTRAVENOUS | Status: AC
  Filled 2024-04-29: qty 20

## 2024-04-29 NOTE — Consult Note (Signed)
 Urology Consult   Physician requesting consult: Dr. Adina Car  Reason for consult: Difficult Foley catheter placement  History of Present Illness: Benjamin Massey is a 79 y.o. male, previously followed by Dr. Cam, with a history of grade 1 prostate cancer that was monitored with active surveillance (last follow-up was in 2022), as well as a history of BPH, s/p TURP in 2014.  He is here today for left hip replacement with Dr. Car.  Urology was consulted intraoperatively for difficult Foley catheter placement after 2 failed attempts by the OR nursing staff.   HPI could not be obtained as the patient was under general anesthesia.  The remainder of his history was obtained from the chart.  Past Medical History:  Diagnosis Date   Acute urinary retention    Arthritis    Bladder stone    Foley catheter in place    History of kidney stones    History of pulmonary embolus (PE) 04/10/2000   BILATERAL--  TX W/ COUMADIN   History of radiation therapy    01/29/24 - 02/25/24 Lip- Dr. Lauraine Golden, MD   Hypercholesteremia    Hypertension    Pre-diabetes    Prostate cancer [facility])    Skin cancer    Solitary kidney    s/p right nephrectomy  age 46 right ureter absent    Past Surgical History:  Procedure Laterality Date   CYSTOSCOPY N/A 05/27/2012   Procedure: CYSTOSCOPY;  Surgeon: Thomasine Oiler, MD;  Location: [facility];  Service: Urology;  Laterality: N/A;  REMOVAL OF BLADDER STONE   NEPHRECTOMY  AGE 89   RIGHT KIDNEY--  NO URETER   PROSTATE BIOPSY  07/30/2014   SURGERY OF LIP     for cancer   TRANSURETHRAL RESECTION OF PROSTATE N/A 05/27/2012   Procedure: TRANSURETHRAL RESECTION OF THE PROSTATE WITH GYRUS INSTRUMENTS;  Surgeon: Thomasine Oiler, MD;  Location: [facility] [place];  Service: Urology;  Laterality: N/A;   URETEROSCOPIC STONE EXTRACTION W/ STENT PLACEMENT  04/10/2000    Current Hospital Medications:  Home Meds: Active Medications[1]  Scheduled  Meds:  dexamethasone  (DECADRON ) injection  8 mg Intravenous Once   furosemide   20 mg Intravenous Once   povidone-iodine   2 Application Topical Once   Continuous Infusions:  lactated ringers  10 mL/hr at 04/29/24 0915   PRN Meds:.  Allergies: Allergies[2]  Family History  Problem Relation Age of Onset   Diabetes Father    Hypertension Father    Alzheimer's disease Father     Social History:  reports that he has never smoked. He has been exposed to tobacco smoke. He has never used smokeless tobacco. He reports that he does not drink alcohol and does not use drugs.  ROS: A complete review of systems was performed.  All systems are negative except for pertinent findings as noted.  Physical Exam:  Vital signs in last 24 hours: Temp:  [97.4 F (36.3 C)] 97.4 F (36.3 C) (01/20 0852) Pulse Rate:  [91] 91 (01/20 0852) Resp:  [16] 16 (01/20 0852) BP: (139)/(91) 139/91 (01/20 0852) SpO2:  [99 %] 99 % (01/20 0852) Constitutional: The patient is currently under general anesthesia  Laboratory Data:  No results for input(s): WBC, HGB, HCT, PLT in the last 72 hours.  No results for input(s): NA, K, CL, GLUCOSE, BUN, CALCIUM, CREATININE in the last 72 hours.  Invalid input(s): CO3   Results for orders placed or performed during the hospital encounter of 04/29/24 (from the past 24  hours)  ABO/Rh     Status: None   Collection Time: 04/29/24  9:17 AM  Result Value Ref Range   ABO/RH(D)      AB POS Performed at [facility], [address], [place], [place] [address]    No results found for this or any previous visit (from the past 240 hours).  Renal Function: Recent Labs    04/22/24 1422  CREATININE 1.18   Estimated Creatinine Clearance: 46.5 mL/min (by C-G formula based on SCr of 1.18 mg/dL).  Radiologic Imaging: No results found.  I independently reviewed the above imaging studies.  Procedure: Cystoscopy with difficult Foley  catheter placement  Pre-Op diagnosis: Urethral stricture Postop diagnosis: Mild membranous urethral trauma from previous Foley catheter placement attempts  Findings: Mild membranous urethral trauma secondary to previous catheter placement attempts.  No intravesical abnormalities were seen  Description of procedure: The patient's genitalia was prepped and draped in usual sterile fashion.  A 16 French flexible cystoscope was then inserted into the urethral meatus and advanced to the membranous urethra were a mild degree of urethral trauma was identified secondary to previous catheter placement attempts.  The flexible cystoscope was then easily advanced into the bladder.  A full inspection of the bladder mucosa revealed no intravesical abnormalities.  The prostatic urethra appeared well resected.  A Glidewire was then advanced through the flexible cystoscope and into the bladder, under direct vision.  The flexible cystoscope was then removed, leaving the wire in place.  An 32 French council tip catheter was then advanced over the Glidewire and into position within the bladder.  10 mL of sterile water was then used to inflate the catheter balloon.  The Glidewire was then removed and the Foley catheter was placed to gravity drainage.  Impression/Recommendation 79 year old male undergoing left hip replacement  - 18 French council tip catheter was cystoscopically placed intraoperatively.  Mild membranous urethral trauma was noted from previous catheter placement attempts.  Recommend keeping Foley catheter in place for 3 to 5 days to allow the urethra to heal.  Will arrange outpatient follow-up with Dr. Cam for catheter removal.  Lonni Han, MD [facility] 04/29/2024, 12:01 PM         [1]  Current Meds  Medication Sig   acetaminophen  (TYLENOL ) 325 MG tablet Take 650 mg by mouth every 6 (six) hours as needed for moderate pain (pain score 4-6).   amLODipine  (NORVASC ) 10 MG  tablet Take 10 mg by mouth every morning.    Cholecalciferol (VITAMIN D) 50 MCG (2000 UT) CAPS Take 2,000 Units by mouth 2 (two) times daily.   COCONUT OIL PO Take 1 tablet by mouth daily.   Coenzyme Q10 (CO Q-10) 100 MG CAPS Take 100 mg by mouth daily.   DNA Collection Product (DYNAMIC CO) Take 2 capsules by mouth daily.   Krill Oil 1000 MG CAPS Take 2,000 mg by mouth 2 (two) times daily.   Lutein-Zeaxanthin (VITEYES ESSENTIALS VISION SUPP PO) Take 1 capsule by mouth daily.   magnesium oxide (MAG-OX) 400 MG tablet Take 400 mg by mouth every morning.   Misc Natural Products (PROSTATE HEALTH PO) Take 2 capsules by mouth daily.   triamterene -hydrochlorothiazide  (MAXZIDE -25) 37.5-25 MG per tablet Take 1 tablet by mouth every morning.    Turmeric (CURCUMIN 95 PO) Take 2 capsules by mouth daily.  [2]  Allergies Allergen Reactions   Atorvastatin Calcium     Other Reaction(s): burning in head, unable to eat, lost 10 lbs  Fenofibrate Cough   Olmesartan Cough   Rosuvastatin     Dizziness (even while driving), achy, unable to eat, lost 10 lbs

## 2024-04-29 NOTE — Transfer of Care (Signed)
 Immediate Anesthesia Transfer of Care Note  Patient: Benjamin Massey  Procedure(s) Performed: ARTHROPLASTY, HIP, TOTAL, ANTERIOR APPROACH (Left: Hip)  Patient Location: PACU  Anesthesia Type:Spinal  Level of Consciousness: awake, alert , and oriented  Airway & Oxygen Therapy: Patient Spontanous Breathing and Patient connected to face mask oxygen  Post-op Assessment: Report given to RN and Post -op Vital signs reviewed and stable  Post vital signs: Reviewed and stable  Last Vitals:  Vitals Value Taken Time  BP 108/56 04/29/24 13:09  Temp    Pulse 69 04/29/24 13:11  Resp 16 04/29/24 13:11  SpO2 99 % 04/29/24 13:11  Vitals shown include unfiled device data.  Last Pain:  Vitals:   04/29/24 0907  TempSrc:   PainSc: 0-No pain         Complications: No notable events documented.

## 2024-04-29 NOTE — OR Nursing (Signed)
 Unsuccessful attempts x2 to place foley catheter. Anesthesia and surgeon notified and urology called for catheter placement

## 2024-04-29 NOTE — Interval H&P Note (Signed)
 History and Physical Interval Note:  04/29/2024 8:33 AM  Benjamin Massey  has presented today for surgery, with the diagnosis of Left hip osteoarthritis.  The various methods of treatment have been discussed with the patient and family. After consideration of risks, benefits and other options for treatment, the patient has consented to  Procedures: ARTHROPLASTY, HIP, TOTAL, ANTERIOR APPROACH (Left) as a surgical intervention.  The patient's history has been reviewed, patient examined, no change in status, stable for surgery.  I have reviewed the patient's chart and labs.  Questions were answered to the patient's satisfaction.     Benjamin Massey

## 2024-04-29 NOTE — Anesthesia Procedure Notes (Signed)
 Spinal  Patient location during procedure: OR Start time: 04/29/2024 11:19 AM End time: 04/29/2024 11:22 AM Reason for block: surgical anesthesia  Staffing Performed: resident/CRNA  Authorized by: Leopoldo Bruckner, MD   Performed by: Therisa Doyal CROME, CRNA  Preanesthetic Checklist Completed: patient identified, IV checked, site marked, risks and benefits discussed, surgical consent, monitors and equipment checked, pre-op evaluation and timeout performed Spinal Block Patient position: sitting Prep: DuraPrep and site prepped and draped Patient monitoring: heart rate, continuous pulse ox and blood pressure Approach: midline Location: L3-4 Injection technique: single-shot Needle Needle type: Pencan  Needle gauge: 24 G Needle length: 10 cm Assessment Sensory level: T6 Events: CSF return  Additional Notes Kit expiration 12/08/2025 and lot #[id number] Clear free flow CSF, negative heme, negative paresthesia Tolerated well and returned to supine position

## 2024-04-29 NOTE — Discharge Instructions (Addendum)
 INSTRUCTIONS AFTER JOINT REPLACEMENT   Remove items at home which could result in a fall. This includes throw rugs or furniture in walking pathways ICE to the affected joint every three hours while awake for 30 minutes at a time, for at least the first 3-5 days, and then as needed for pain and swelling.  Continue to use ice for pain and swelling. You may notice swelling that will progress down to the foot and ankle.  This is normal after surgery.  Elevate your leg when you are not up walking on it.   Continue to use the breathing machine you got in the hospital (incentive spirometer) which will help keep your temperature down.  It is common for your temperature to cycle up and down following surgery, especially at night when you are not up moving around and exerting yourself.  The breathing machine keeps your lungs expanded and your temperature down.   DIET:  As you were doing prior to hospitalization, we recommend a well-balanced diet.  DRESSING / WOUND CARE / SHOWERING  Keep the surgical dressing until follow up.  The dressing is water proof, so you can shower without any extra covering.  IF THE DRESSING FALLS OFF or the wound gets wet inside, change the dressing with sterile gauze.  Please use good hand washing techniques before changing the dressing.  Do not use any lotions or creams on the incision until instructed by your surgeon.    ACTIVITY  Increase activity slowly as tolerated, but follow the weight bearing instructions below.   No driving for 6 weeks or until further direction given by your physician.  You cannot drive while taking narcotics.  No lifting or carrying greater than 10 lbs. until further directed by your surgeon. Avoid periods of inactivity such as sitting longer than an hour when not asleep. This helps prevent blood clots.  You may return to work once you are authorized by your doctor.     WEIGHT BEARING   Weight bearing as tolerated with assist device (walker, cane,  etc) as directed, use it as long as suggested by your surgeon or therapist, typically at least 4-6 weeks.   EXERCISES  Results after joint replacement surgery are often greatly improved when you follow the exercise, range of motion and muscle strengthening exercises prescribed by your doctor. Safety measures are also important to protect the joint from further injury. Any time any of these exercises cause you to have increased pain or swelling, decrease what you are doing until you are comfortable again and then slowly increase them. If you have problems or questions, call your caregiver or physical therapist for advice.   Rehabilitation is important following a joint replacement. After just a few days of immobilization, the muscles of the leg can become weakened and shrink (atrophy).  These exercises are designed to build up the tone and strength of the thigh and leg muscles and to improve motion. Often times heat used for twenty to thirty minutes before working out will loosen up your tissues and help with improving the range of motion but do not use heat for the first two weeks following surgery (sometimes heat can increase post-operative swelling).   These exercises can be done on a training (exercise) mat, on the floor, on a table or on a bed. Use whatever works the best and is most comfortable for you.    Use music or television while you are exercising so that the exercises are a pleasant break in your  day. This will make your life better with the exercises acting as a break in your routine that you can look forward to.   Perform all exercises about fifteen times, three times per day or as directed.  You should exercise both the operative leg and the other leg as well.  Exercises include:   Quad Sets - Tighten up the muscle on the front of the thigh (Quad) and hold for 5-10 seconds.   Straight Leg Raises - With your knee straight (if you were given a brace, keep it on), lift the leg to 60  degrees, hold for 3 seconds, and slowly lower the leg.  Perform this exercise against resistance later as your leg gets stronger.  Leg Slides: Lying on your back, slowly slide your foot toward your buttocks, bending your knee up off the floor (only go as far as is comfortable). Then slowly slide your foot back down until your leg is flat on the floor again.  Angel Wings: Lying on your back spread your legs to the side as far apart as you can without causing discomfort.  Hamstring Strength:  Lying on your back, push your heel against the floor with your leg straight by tightening up the muscles of your buttocks.  Repeat, but this time bend your knee to a comfortable angle, and push your heel against the floor.  You may put a pillow under the heel to make it more comfortable if necessary.   A rehabilitation program following joint replacement surgery can speed recovery and prevent re-injury in the future due to weakened muscles. Contact your doctor or a physical therapist for more information on knee rehabilitation.    CONSTIPATION  Constipation is defined medically as fewer than three stools per week and severe constipation as less than one stool per week.  Even if you have a regular bowel pattern at home, your normal regimen is likely to be disrupted due to multiple reasons following surgery.  Combination of anesthesia, postoperative narcotics, change in appetite and fluid intake all can affect your bowels.   YOU MUST use at least one of the following options; they are listed in order of increasing strength to get the job done.  They are all available over the counter, and you may need to use some, POSSIBLY even all of these options:    Drink plenty of fluids (prune juice may be helpful) and high fiber foods Colace 100 mg by mouth twice a day  Senokot for constipation as directed and as needed Dulcolax (bisacodyl ), take with full glass of water  Miralax  (polyethylene glycol) once or twice a day as  needed.  If you have tried all these things and are unable to have a bowel movement in the first 3-4 days after surgery call either your surgeon or your primary doctor.    If you experience loose stools or diarrhea, hold the medications until you stool forms back up.  If your symptoms do not get better within 1 week or if they get worse, check with your doctor.  If you experience the worst abdominal pain ever or develop nausea or vomiting, please contact the office immediately for further recommendations for treatment.   ITCHING:  If you experience itching with your medications, try taking only a single pain pill, or even half a pain pill at a time.  You can also use Benadryl  over the counter for itching or also to help with sleep.   TED HOSE STOCKINGS:  Use stockings on both  legs until for at least 2 weeks or as directed by physician office. They may be removed at night for sleeping.  MEDICATIONS:  See your medication summary on the After Visit Summary that nursing will review with you.  You may have some home medications which will be placed on hold until you complete the course of blood thinner medication.  It is important for you to complete the blood thinner medication as prescribed.  PRECAUTIONS:  If you experience chest pain or shortness of breath - call 911 immediately for transfer to the hospital emergency department.   If you develop a fever greater that 101 F, purulent drainage from wound, increased redness or drainage from wound, foul odor from the wound/dressing, or calf pain - CONTACT YOUR SURGEON.                                                   FOLLOW-UP APPOINTMENTS:  If you do not already have a post-op appointment, please call the office for an appointment to be seen by your surgeon.  Guidelines for how soon to be seen are listed in your After Visit Summary, but are typically between 1-4 weeks after surgery.  OTHER INSTRUCTIONS:   Knee Replacement:  Do not place pillow  under knee, focus on keeping the knee straight while resting. CPM instructions: 0-90 degrees, 2 hours in the morning, 2 hours in the afternoon, and 2 hours in the evening. Place foam block, curve side up under heel at all times except when in CPM or when walking.  DO NOT modify, tear, cut, or change the foam block in any way.  POST-OPERATIVE OPIOID TAPER INSTRUCTIONS: It is important to wean off of your opioid medication as soon as possible. If you do not need pain medication after your surgery it is ok to stop day one. Opioids include: Codeine, Hydrocodone (Norco, Vicodin), Oxycodone (Percocet, oxycontin ) and hydromorphone  amongst others.  Long term and even short term use of opiods can cause: Increased pain response Dependence Constipation Depression Respiratory depression And more.  Withdrawal symptoms can include Flu like symptoms Nausea, vomiting And more Techniques to manage these symptoms Hydrate well Eat regular healthy meals Stay active Use relaxation techniques(deep breathing, meditating, yoga) Do Not substitute Alcohol to help with tapering If you have been on opioids for less than two weeks and do not have pain than it is ok to stop all together.  Plan to wean off of opioids This plan should start within one week post op of your joint replacement. Maintain the same interval or time between taking each dose and first decrease the dose.  Cut the total daily intake of opioids by one tablet each day Next start to increase the time between doses. The last dose that should be eliminated is the evening dose.   MAKE SURE YOU:  Understand these instructions.  Get help right away if you are not doing well or get worse.    Thank you for letting us  be a part of your medical care team.  It is a privilege we respect greatly.  We hope these instructions will help you stay on track for a fast and full recovery!     Information on my medicine - ELIQUIS  (apixaban )  This medication  education was reviewed with me or my healthcare representative as part of my discharge preparation.  The pharmacist  that spoke with me during my hospital stay was:  Casimir Camelia Ours, [facility]  Why was Eliquis  prescribed for you? Eliquis  was prescribed for you to reduce the risk of blood clots forming after orthopedic surgery.    What do You need to know about Eliquis ? Take your Eliquis  TWICE DAILY - one tablet in the morning and one tablet in the evening with or without food.  It would be best to take the dose about the same time each day.  If you have difficulty swallowing the tablet whole please discuss with your pharmacist how to take the medication safely.  Take Eliquis  exactly as prescribed by your doctor and DO NOT stop taking Eliquis  without talking to the doctor who prescribed the medication.  Stopping without other medication to take the place of Eliquis  may increase your risk of developing a clot.  After discharge, you should have regular check-up appointments with your healthcare provider that is prescribing your Eliquis .  What do you do if you miss a dose? If a dose of ELIQUIS  is not taken at the scheduled time, take it as soon as possible on the same day and twice-daily administration should be resumed.  The dose should not be doubled to make up for a missed dose.  Do not take more than one tablet of ELIQUIS  at the same time.  Important Safety Information A possible side effect of Eliquis  is bleeding. You should call your healthcare provider right away if you experience any of the following: Bleeding from an injury or your nose that does not stop. Unusual colored urine (red or dark brown) or unusual colored stools (red or black). Unusual bruising for unknown reasons. A serious fall or if you hit your head (even if there is no bleeding).  Some medicines may interact with Eliquis  and might increase your risk of bleeding or clotting while on Eliquis . To help  avoid this, consult your healthcare provider or pharmacist prior to using any new prescription or non-prescription medications, including herbals, vitamins, non-steroidal anti-inflammatory drugs (NSAIDs) and supplements.  This website has more information on Eliquis  (apixaban ): http://[email or web address] .com/eliquis dena

## 2024-04-29 NOTE — Anesthesia Procedure Notes (Signed)
 Date/Time: 04/29/2024 11:17 AM  Performed by: Therisa Doyal CROME, CRNAOxygen Delivery Method: Simple face mask

## 2024-04-29 NOTE — Evaluation (Signed)
 Physical Therapy Evaluation Patient Details Name: Benjamin Massey MRN: [id number] DOB: February 24, 1946 Today's Date: 04/29/2024  History of Present Illness  79 yo male presents to therapy s/p L THA, anterior approach on 04/29/2024 due to failure of conservative measures. 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  Clinical Impression      Benjamin Massey is a 79 y.o. male POD 0 s/p L THA. Patient reports mod I with mobility at baseline. Patient is now limited by functional impairments (see PT problem list below) and requires CGA for bed mobility and CGA for transfers. Patient was able to ambulate 50 feet with RW and CGA level of assist. Patient instructed in exercise to facilitate ROM and circulation to manage edema.  Patient will benefit from continued skilled PT interventions to address impairments and progress towards PLOF. Acute PT will follow to progress mobility and stair training in preparation for safe discharge home with family support and HEP.     If plan is discharge home, recommend the following: A little help with walking and/or transfers;A little help with bathing/dressing/bathroom;Assistance with cooking/housework;Assist for transportation   Can travel by private vehicle        Equipment Recommendations Rolling walker (2 wheels)  Recommendations for Other Services       Functional Status Assessment Patient has had a recent decline in their functional status and demonstrates the ability to make significant improvements in function in a reasonable and predictable amount of time.     Precautions / Restrictions Precautions Precautions: Fall Restrictions Weight Bearing Restrictions Per Provider Order: Yes LLE Weight Bearing Per Provider Order: Weight bearing as tolerated      Mobility  Bed Mobility Overal bed mobility: Needs Assistance Bed Mobility: Supine to  Sit     Supine to sit: Contact guard, HOB elevated     General bed mobility comments: min cues    Transfers Overall transfer level: Needs assistance Equipment used: Rolling walker (2 wheels) Transfers: Sit to/from Stand Sit to Stand: Contact guard assist           General transfer comment: min cues    Ambulation/Gait Ambulation/Gait assistance: Contact guard assist Gait Distance (Feet): 50 Feet Assistive device: Rolling walker (2 wheels) Gait Pattern/deviations: Step-to pattern, Decreased stance time - left, Antalgic, Trunk flexed Gait velocity: decreased     General Gait Details: slight trunk flexion with B UE support at RW to offload L LE in stance phase, pain with trunk extension, min cues for safety and RW management  Stairs            Wheelchair Mobility     Tilt Bed    Modified Rankin (Stroke Patients Only)       Balance Overall balance assessment: Needs assistance Sitting-balance support: Feet supported Sitting balance-Leahy Scale: Good     Standing balance support: Bilateral upper extremity supported, During functional activity, Reliant on assistive device for balance Standing balance-Leahy Scale: Poor                               Pertinent Vitals/Pain Pain Assessment Pain Assessment: 0-10 Pain Score: 3  Pain Location: L hip and LE Pain Descriptors / Indicators: Aching, Discomfort, Grimacing, Operative site guarding Pain Intervention(s): Limited activity within patient's tolerance, Monitored during session, Premedicated before session, Repositioned, Ice applied    Home Living Family/patient  expects to be discharged to:: Private residence Living Arrangements: Alone Available Help at Discharge: Family Type of Home: House Home Access: Stairs to enter;Ramped entrance Entrance Stairs-Rails: Left Entrance Stairs-Number of Steps: 3   Home Layout: One level (basement) Home Equipment: Cane - single point Additional Comments:  Spouse resides in memory care unit    Prior Function Prior Level of Function : Independent/Modified Independent;Driving             Mobility Comments: mod I with SPC for all ADLs, self care tasks and IADLs.       Extremity/Trunk Assessment        Lower Extremity Assessment Lower Extremity Assessment: LLE deficits/detail LLE Deficits / Details: ankle DF/PF 5/5; SLR < 10 degree lag LLE Sensation: WNL    Cervical / Trunk Assessment Cervical / Trunk Assessment: Normal  Communication   Communication Communication: No apparent difficulties    Cognition Arousal: Alert Behavior During Therapy: WFL for tasks assessed/performed   PT - Cognitive impairments: No apparent impairments                         Following commands: Intact       Cueing       General Comments      Exercises Total Joint Exercises Ankle Circles/Pumps: AROM, Both, 10 reps Quad Sets: AROM, Left, 5 reps Heel Slides: AROM, Left, 5 reps   Assessment/Plan    PT Assessment Patient needs continued PT services  PT Problem List Decreased strength;Decreased range of motion;Decreased activity tolerance;Decreased mobility;Decreased coordination;Pain       PT Treatment Interventions DME instruction;Gait training;Stair training;Functional mobility training;Therapeutic activities;Therapeutic exercise;Balance training;Neuromuscular re-education;Patient/family education;Modalities    PT Goals (Current goals can be found in the Care Plan section)  Acute Rehab PT Goals Patient Stated Goal: to be able to return to walking for exercise PT Goal Formulation: With patient Time For Goal Achievement: 05/13/24 Potential to Achieve Goals: Good    Frequency 7X/week     Co-evaluation               AM-PAC PT 6 Clicks Mobility  Outcome Measure Help needed turning from your back to your side while in a flat bed without using bedrails?: None Help needed moving from lying on your back to sitting  on the side of a flat bed without using bedrails?: A Little Help needed moving to and from a bed to a chair (including a wheelchair)?: A Little Help needed standing up from a chair using your arms (e.g., wheelchair or bedside chair)?: A Little Help needed to walk in hospital room?: A Little Help needed climbing 3-5 steps with a railing? : A Little 6 Click Score: 19    End of Session Equipment Utilized During Treatment: Gait belt Activity Tolerance: Patient tolerated treatment well;No increased pain Patient left: in chair;with call bell/phone within reach;with chair alarm set Nurse Communication: Mobility status PT Visit Diagnosis: Unsteadiness on feet (R26.81);Other abnormalities of gait and mobility (R26.89);Muscle weakness (generalized) (M62.81);Difficulty in walking, not elsewhere classified (R26.2);Pain Pain - Right/Left: Left Pain - part of body: Hip;Leg    Time: 8341-8282 PT Time Calculation (min) (ACUTE ONLY): 19 min   Charges:   PT Evaluation $PT Eval Low Complexity: 1 Low   PT General Charges $$ ACUTE PT VISIT: 1 Visit         [place], PT Acute Rehab   [place] VEAR Drone 04/29/2024, 5:39 PM

## 2024-04-29 NOTE — H&P (Signed)
 TOTAL HIP ADMISSION H&P  Patient is admitted for left total hip arthroplasty.  Therapy Plans: HEP Disposition: Home with daughter (staying at patients house) Planned DVT Prophylaxis: Xarelto 10mg  daily DME needed: walker PCP: Clemencia Hint - clearance received Cardio: Dr. Jeffrie - TXA: IV Allergies: statins, olmesartan Anesthesia Concerns: none BMI: 27.8 Last HgbA1c: Not diabetic   Other: - Hx of PE - unknown cause, happened after a kidney stone - happened in 2002 - tramadol /oxycodone , robaxin , tylenol  - No NSAIDs - only has one kidney   Subjective:  Chief Complaint: Left hip pain  HPI: Benjamin Massey, 79 y.o. male, has a history of pain and functional disability in the left hip due to arthritis and patient has failed non-surgical conservative treatments for greater than 12 weeks to include NSAID's and/or analgesics and activity modification. Onset of symptoms was gradual, starting 2 years ago with gradual worsening course since that time. The patient noted no prior surgeries on the left hip. Patient currently rates pain in the left hip at 9 out of 10 with activity. Patient has night pain, worsening of pain with activity and weight bearing, and pain with passive range of motion. Patient has evidence of joint space narrowing by imaging studies. This condition presents safety issues increasing the risk of falls. There is no current active infection.  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    Past Surgical History:  Procedure Laterality Date   CYSTOSCOPY N/A 05/27/2012   Procedure: CYSTOSCOPY;  Surgeon: Thomasine Oiler, MD;  Location: [facility] [place];  Service: Urology;  Laterality: N/A;  REMOVAL OF BLADDER STONE   NEPHRECTOMY  AGE 78   RIGHT KIDNEY--  NO URETER   PROSTATE BIOPSY  07/30/2014   SURGERY OF LIP     for cancer   TRANSURETHRAL RESECTION OF PROSTATE N/A 05/27/2012   Procedure: TRANSURETHRAL RESECTION OF THE PROSTATE WITH GYRUS INSTRUMENTS;  Surgeon: Thomasine Oiler, MD;  Location: [facility] [place];  Service: Urology;  Laterality: N/A;   URETEROSCOPIC STONE EXTRACTION W/ STENT PLACEMENT  04/10/2000    Prior to Admission medications  Medication Sig Start Date End Date Taking? Authorizing Provider  acetaminophen  (TYLENOL ) 325 MG tablet Take 650 mg by mouth every 6 (six) hours as needed for moderate pain (pain score 4-6).   Yes [provider]  amLODipine  (NORVASC ) 10 MG tablet Take 10 mg by mouth every morning.    Yes [provider]  Cholecalciferol (VITAMIN D) 50 MCG (2000 UT) CAPS Take 2,000 Units by mouth 2 (two) times daily.   Yes [provider]  COCONUT OIL PO Take 1 tablet by mouth daily.   Yes [provider]  Coenzyme Q10 (CO Q-10) 100 MG CAPS Take 100 mg by mouth daily.  Yes [provider]  DNA Collection Product (DYNAMIC CO) Take 2 capsules by mouth daily.   Yes [provider]  Anselm Oil 1000 MG CAPS Take 2,000 mg by mouth 2 (two) times daily.   Yes [provider]  Lutein-Zeaxanthin (VITEYES ESSENTIALS VISION SUPP PO) Take 1 capsule by mouth daily.   Yes [provider]  magnesium oxide (MAG-OX) 400 MG tablet Take 400 mg by mouth every morning.   Yes [provider]  Misc Natural Products (PROSTATE HEALTH PO) Take 2 capsules by mouth daily.   Yes [provider]  triamterene -hydrochlorothiazide  (MAXZIDE -25) 37.5-25 MG per tablet Take 1 tablet by mouth every morning.    Yes [provider]  Turmeric (CURCUMIN 95 PO) Take 2 capsules by mouth daily.   Yes [provider]    Allergies[1]  Social History   Socioeconomic History   Marital status: Married    Spouse name: Not on file   Number of children: 3   Years of education: Not on file   Highest education level: Not on file  Occupational History   Not on file  Tobacco Use   Smoking status: Never    Passive exposure: Past (when you could smoke at work he was around it)   Smokeless tobacco: Never  Vaping Use   Vaping status: Never Used  Substance and Sexual Activity   Alcohol use: Never   Drug use: No   Sexual activity: Not on file  Other Topics Concern   Not on file  Social History Narrative   Not on file   Social Drivers of Health   Tobacco Use: Low Risk (04/23/2024)   Patient History    Smoking Tobacco Use: Never    Smokeless Tobacco Use: Never    Passive Exposure: Past  Financial Resource Strain: Not on file  Food Insecurity: Not on file  Transportation Needs: Not on file  Physical Activity: Not on file  Stress: Not on file  Social Connections: Not on file  Intimate Partner Violence: Not on file  Depression (EYV7-0): Not on file  Alcohol Screen: Not on file  Housing: Not on file  Utilities: Not on file  Health Literacy: Not on file    Tobacco Use: Low Risk (04/23/2024)   Patient History    Smoking Tobacco Use: Never    Smokeless Tobacco Use: Never    Passive Exposure: Past   Social History   Substance and Sexual Activity  Alcohol Use Never    Family History  Problem Relation Age of Onset   Diabetes Father    Hypertension Father    Alzheimer's disease Father     Review Of Systems: Constitutional: Constitutional: no fever, chills, night sweats, or significant weight loss. Cardiovascular: Cardiovascular: no palpitations or  chest pain. Respiratory: Respiratory: no cough or shortness of breath and No COPD. Gastrointestinal: Gastrointestinal: no vomiting or nausea. Musculoskeletal: Musculoskeletal: Joint Pain and swelling in Joints. Neurologic: Neurologic: no numbness, tingling, or difficulty with balance.   Objective:  Physical Exam: Left hip exam: Predominant finding is reproducible groin pain with hip flexion internal rotation over 5 degrees with pelvic tilting, external rotation over 20 degrees Slight weakness with active hip flexion due to discomfort with mild external rotation contracture No lower extremity edema, erythema or calf tenderness Right hip exam reveals more fluid range of motion with tightness but without significant groin pain  Vital signs in last 24 hours:    Imaging Review Plain radiographs demonstrate severe degenerative joint disease  of the left hip. The bone quality appears to be adequate for age and reported activity level.  Assessment/Plan:  End stage arthritis, left hip  The patient history, physical examination, clinical judgement of the provider and imaging studies are consistent with end stage degenerative joint disease of the left hip and total hip arthroplasty is deemed medically necessary. The treatment options including medical management, injection therapy, arthroscopy and arthroplasty were discussed at length. The risks and benefits of total hip arthroplasty were presented and reviewed. The risks due to aseptic loosening, infection, stiffness, dislocation/subluxation, thromboembolic complications and other imponderables were discussed. The patient acknowledged the explanation, agreed to proceed with the plan and consent was signed. Patient is being admitted for inpatient treatment for surgery, pain control, PT, OT, prophylactic antibiotics, VTE prophylaxis, progressive ambulation and ADLs and discharge planning.The patient is planning to be discharged home.  Rosina Calin,  PA-C Orthopedic Surgery [facility] [place] [phone]      [1]  Allergies Allergen Reactions   Atorvastatin Calcium     Other Reaction(s): burning in head, unable to eat, lost 10 lbs    Fenofibrate Cough   Olmesartan Cough   Rosuvastatin     Dizziness (even while driving), achy, unable to eat, lost 10 lbs

## 2024-04-29 NOTE — Plan of Care (Signed)

## 2024-04-29 NOTE — Care Plan (Signed)
 Ortho Bundle Case Management Note  Patient Details  Name: Benjamin Massey MRN: [id number] Date of Birth: 1946-03-30  LT THA on 04/29/24  DCP: Home with daughter  DME: RW; ordered through [employer]  PT: HEP                   DME Arranged:  Walker rolling DME Agency:  [employer]  HH Arranged:    HH Agency:     Additional Comments: Please contact me with any questions of if this plan should need to change.  Burnard Dross, Case Manager [facility] [phone]  Ext. [phone]   04/29/2024, 9:00 AM

## 2024-04-29 NOTE — Op Note (Signed)
 MEDICAL RECORD NO.: [id number]      FACILITY:  [facility]      PHYSICIAN:  Donnice JONETTA Car  DATE OF BIRTH:  February 03, 1946     DATE OF PROCEDURE:  04/29/2024                                 OPERATIVE REPORT         PREOPERATIVE DIAGNOSIS: left hip osteoarthritis.      POSTOPERATIVE DIAGNOSIS:  left hip osteoarthritis.      PROCEDURE:  left total hip replacement through an anterior approach   utilizing DePuy THR system, component size 56 mm pinnacle cup, a size 36+4 neutral   Altrex liner, a size 7 Hi Actis stem with a 36+12 Articuleze metal head ball      SURGEON:  Donnice JONETTA. Car, M.D.      ASSISTANT:  Rosina Calin, PA-C     ANESTHESIA:  Spinal.      SPECIMENS:  None.      COMPLICATIONS:  None.      BLOOD LOSS:  200 cc     DRAINS:  None.      INDICATION OF THE PROCEDURE:  Benjamin Massey is a 79 y.o. male who had   presented to office for evaluation of {left hip pain.  Radiographs revealed   progressive degenerative changes with bone-on-bone   articulation of the  hip joint, including subchondral cystic changes and osteophytes.  The patient had painful limited range of   motion significantly affecting their overall quality of life and function.  The patient was failing to    respond to conservative measures including medications and/or injections and activity modification and at this point was ready   to proceed with more definitive measures.  Consent was obtained for   benefit of pain relief.  Specific risks of infection, DVT, component   failure, dislocation, neurovascular injury, and need for revision surgery were reviewed in the office.     PROCEDURE IN DETAIL:  The patient was brought to operative theater.   Once adequate anesthesia, preoperative antibiotics, 2 gm of Ancef , 1 gm of Tranexamic Acid , and 10 mg of Decadron  were administered, the patient was positioned supine on the [employer] table.  Once the patient was safely positioned with adequate  padding and protection of bony prominences we predraped out the hip, and used fluoroscopy to confirm orientation of the pelvis.      The left hip was then prepped and draped from proximal iliac crest to   mid thigh with a shower curtain technique.      A time-out was performed identifying the patient, planned procedure, and the appropriate extremity.     An incision was then made 2 cm lateral to the   anterior superior iliac spine extending over the orientation of the   tensor fascia lata muscle and sharp dissection was carried down to the   fascia of the muscle.      The fascia was then incised.  The muscle belly was identified and swept   laterally and retractor placed along the superior neck.  Following   cauterization of the circumflex vessels and removing some pericapsular   fat, a second cobra retractor was placed on the inferior neck.  A T-capsulotomy was made along the line of the   superior neck to the trochanteric fossa, then extended proximally and   distally.  Tag  sutures were placed and the retractors were then placed   intracapsular.  We then identified the trochanteric fossa and   orientation of my neck cut and then made a neck osteotomy with the femur on traction.  The femoral   head was removed without difficulty or complication.  Traction was let   off and retractors were placed posterior and anterior around the   acetabulum.      The labrum and foveal tissue were debrided.  I began reaming with a 52 mm   reamer and reamed up to 55 mm reamer with good bony bed preparation and a 56 mm  cup was chosen.  The final 56 mm Pinnacle cup was then impacted under fluoroscopy to confirm the depth of penetration and orientation with respect to   Abduction and forward flexion.  A screw was placed into the ilium.  The final   36+4 neutral Altrex liner was impacted with good visualized rim fit.  The cup was positioned anatomically within the acetabular portion of the pelvis.      At  this point, the femur was rolled to 100 degrees.  Further capsule was   released off the inferior aspect of the femoral neck.  I then   released the superior capsule proximally.  With the leg in a neutral position the hook was placed laterally   along the femur under the vastus lateralis origin and elevated manually and then held in position using the hook attachment on the bed.  The leg was then extended and adducted with the leg rolled to 100   degrees of external rotation with traction off.  Retractors were placed along the medial calcar and posteriorly over the greater trochanter.  Once the proximal femur was fully   exposed, I used a box osteotome to set orientation.  I then began   broaching with the starting chili pepper broach and passed this by hand and then broached up to 7.  With the 7 broach in place I chose a high offset neck and did several trial reductions.  The offset was appropriate, leg lengths   appeared to be equal best matched with the +12 head ball trial confirmed radiographically.   Given these findings, I went ahead and dislocated the hip, repositioned all   retractors and positioned the left hip in the extended and abducted position.  The final 7 Hi Actis stem was   chosen and it was impacted down to the level of neck cut.  Based on this   and the trial reductions, a final 36+12 Articuleze metal head ball was chosen and   impacted onto a clean and dry trunnion, and the hip was reduced.  The   hip had been irrigated throughout the case again at this point.  I did   reapproximate the superior capsular leaflet to the anterior leaflet   using #1 Vicryl.  The fascia of the   tensor fascia lata muscle was then reapproximated using #1 Vicryl and #0 Stratafix sutures.  The   remaining wound was closed with 2-0 Vicryl and running 4-0 Monocryl.   The hip was cleaned, dried, and dressed sterilely using Dermabond and   Aquacel dressing.  The patient was then brought   to recovery  room in stable condition tolerating the procedure well.    PA Patti was present for the entirety of the case involved from   preoperative positioning, perioperative retractor management, general   facilitation of the case, as well as primary  wound closure as assistant.            Donnice CORDOBA Ernie, M.D.        04/29/2024 12:55 PM

## 2024-04-30 ENCOUNTER — Encounter (HOSPITAL_COMMUNITY): Payer: Self-pay | Admitting: Orthopedic Surgery

## 2024-04-30 ENCOUNTER — Other Ambulatory Visit (HOSPITAL_COMMUNITY): Payer: Self-pay

## 2024-04-30 DIAGNOSIS — M1612 Unilateral primary osteoarthritis, left hip: Secondary | ICD-10-CM | POA: Diagnosis not present

## 2024-04-30 LAB — CBC
HCT: 33.5 % — ABNORMAL LOW (ref 39.0–52.0)
Hemoglobin: 11 g/dL — ABNORMAL LOW (ref 13.0–17.0)
MCH: 29.3 pg (ref 26.0–34.0)
MCHC: 32.8 g/dL (ref 30.0–36.0)
MCV: 89.1 fL (ref 80.0–100.0)
Platelets: 280 K/uL (ref 150–400)
RBC: 3.76 MIL/uL — ABNORMAL LOW (ref 4.22–5.81)
RDW: 13.6 % (ref 11.5–15.5)
WBC: 16.4 K/uL — ABNORMAL HIGH (ref 4.0–10.5)
nRBC: 0 % (ref 0.0–0.2)

## 2024-04-30 LAB — BASIC METABOLIC PANEL WITH GFR
Anion gap: 14 (ref 5–15)
BUN: 15 mg/dL (ref 8–23)
CO2: 22 mmol/L (ref 22–32)
Calcium: 8.8 mg/dL — ABNORMAL LOW (ref 8.9–10.3)
Chloride: 105 mmol/L (ref 98–111)
Creatinine, Ser: 1.18 mg/dL (ref 0.61–1.24)
GFR, Estimated: >60 mL/min
Glucose, Bld: 162 mg/dL — ABNORMAL HIGH (ref 70–99)
Potassium: 3.9 mmol/L (ref 3.5–5.1)
Sodium: 140 mmol/L (ref 135–145)

## 2024-04-30 MED ORDER — OXYCODONE HCL 5 MG PO TABS
5.0000 mg | ORAL_TABLET | ORAL | 0 refills | Status: AC | PRN
  Filled 2024-04-30: qty 42, 7d supply, fill #0

## 2024-04-30 MED ORDER — METHOCARBAMOL 500 MG PO TABS
500.0000 mg | ORAL_TABLET | Freq: Four times a day (QID) | ORAL | 0 refills | Status: AC | PRN
  Filled 2024-04-30: qty 40, 10d supply, fill #0

## 2024-04-30 MED ORDER — POLYETHYLENE GLYCOL 3350 17 GM/SCOOP PO POWD
17.0000 g | Freq: Two times a day (BID) | ORAL | 0 refills | Status: AC
  Filled 2024-04-30: qty 238, 7d supply, fill #0

## 2024-04-30 MED ORDER — APIXABAN 2.5 MG PO TABS
2.5000 mg | ORAL_TABLET | Freq: Two times a day (BID) | ORAL | 0 refills | Status: AC
  Filled 2024-04-30: qty 56, 28d supply, fill #0

## 2024-04-30 MED ORDER — SENNA 8.6 MG PO TABS
2.0000 | ORAL_TABLET | Freq: Every day | ORAL | 0 refills | Status: AC
  Filled 2024-04-30: qty 28, 14d supply, fill #0

## 2024-04-30 NOTE — Progress Notes (Addendum)
 Patient discharged to home w/ family. Given all belongings, instructions, medications. Verbalized understanding of instructions. Verbalized understanding of foley care and plan. Switched to leg bag,. Provided full bag for home. Escorted to pov via w/c.

## 2024-04-30 NOTE — Care Management Obs Status (Signed)
 MEDICARE OBSERVATION STATUS NOTIFICATION   Patient Details  Name: HARUO STEPANEK MRN: [id number] Date of Birth: February 14, 1946   Medicare Observation Status Notification Given:  Yes    Heather DELENA Saltness, LCSW 04/30/2024, 10:52 AM

## 2024-04-30 NOTE — TOC Transition Note (Signed)
 Transition of Care [facility]) - Discharge Note   Patient Details  Name: Benjamin Massey MRN: [id number] Date of Birth: Jan 20, 1946  [facility] CM/SW Contact:  Heather DELENA Saltness, LCSW Phone Number: 04/30/2024, 11:24 AM   Clinical Narrative:    Pt discharging home today with HEP. RW delivered to bedside by [employer]. No further TOC needs at this time.   Final next level of care: Home/Self Care Barriers to Discharge: Barriers Resolved   Patient Goals and CMS Choice Patient states their goals for this hospitalization and ongoing recovery are:: To return home   Choice offered to / list presented to : NA      Discharge Placement  Home              Patient to be transferred to facility by: Family Name of family member notified: Patient Patient and family notified of of transfer: 04/30/24  Discharge Plan and Services Additional resources added to the After Visit Summary for  Follow Up                DME Arranged: Walker rolling DME Agency: [employer]       HH Arranged: NA HH Agency: NA        Social Drivers of Health (SDOH) Interventions SDOH Screenings   Food Insecurity: No Food Insecurity (04/29/2024)  Housing: Low Risk (04/29/2024)  Transportation Needs: No Transportation Needs (04/29/2024)  Utilities: Not At Risk (04/29/2024)  Social Connections: Moderately Integrated (04/29/2024)  Tobacco Use: Low Risk (04/29/2024)     Readmission Risk Interventions     No data to display           Signed: Heather Saltness, MSW, LCSW Clinical Social Worker Inpatient Care Management 04/30/2024 11:25 AM

## 2024-04-30 NOTE — Progress Notes (Signed)
 Discharge meds in a secure bag delivered to patient by this RN

## 2024-04-30 NOTE — Progress Notes (Signed)
 Physical Therapy Treatment Patient Details Name: Benjamin Massey MRN: [id number] DOB: 1945-11-14 Today's Date: 04/30/2024   History of Present Illness 79 yo male presents to therapy s/p L THA, anterior approach on 04/29/2024 due to failure of conservative measures. 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    PT Comments  POD # 1 am session PT - Cognition Comments: AxO x 3 pleasanta nd motivated.  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  Plans to return home with Family/Daughter support. Assisted OOB.  General bed mobility comments: increased time, self able with instruction on proper tech use of leg strap. General transfer comment: self able with VC's on proper hand placement and safety with turns.  Good safety cognition.  Slow.  Steady. General Gait Details: tolerated a functional distance with min VC's on proper walker to self distance and safety with turn completion. General stair comments: Up/down 2 steps using B hands on LEFT rail and Min VC's on proper sequencing/safety.  Pt was able to self recall, up with the good and down with bad.  Tolerated well. Then returned to room to perform some TE's following HEP handout.  Instructed on proper tech, freq as well as use of ICE.   Addressed all mobility questions, discussed appropriate activity, educated on use of ICE.  Pt ready for D/C to home.    If plan is discharge home, recommend the following: A little help with walking and/or transfers;A little help with bathing/dressing/bathroom;Assistance with cooking/housework;Assist for transportation   Can travel by private vehicle        Equipment Recommendations  Rolling walker (2 wheels)    Recommendations for Other Services       Precautions / Restrictions Precautions Precautions: Fall Restrictions Weight Bearing Restrictions  Per Provider Order: No LLE Weight Bearing Per Provider Order: Weight bearing as tolerated     Mobility  Bed Mobility Overal bed mobility: Needs Assistance Bed Mobility: Supine to Sit     Supine to sit: Supervision, Contact guard     General bed mobility comments: increased time, self able with instruction on proper tech use of leg strap.    Transfers Overall transfer level: Needs assistance Equipment used: Rolling walker (2 wheels) Transfers: Sit to/from Stand Sit to Stand: Supervision, Contact guard assist           General transfer comment: self able with VC's on proper hand placement and safety with turns.  Good safety cognition.  Slow.  Steady.    Ambulation/Gait Ambulation/Gait assistance: Supervision, Contact guard assist Gait Distance (Feet): 75 Feet Assistive device: Rolling walker (2 wheels) Gait Pattern/deviations: Step-through pattern Gait velocity: decreased     General Gait Details: tolerated a functional distance with min VC's on proper walker to self distance and safety with turn completion.   Stairs Stairs: Yes Stairs assistance: Contact guard assist, Min assist Stair Management: One rail Left, Step to pattern, Forwards Number of Stairs: 2 General stair comments: Up/down 2 steps using B hands on LEFT rail and Min VC's on proper sequencing/safety.  Pt was able to self recall, up with the good and down with bad.  Tolerated well.   Wheelchair Mobility     Tilt Bed    Modified Rankin (Stroke Patients Only)       Balance  Communication    Cognition Arousal: Alert Behavior During Therapy: WFL for tasks assessed/performed   PT - Cognitive impairments: No apparent impairments                       PT - Cognition Comments: AxO x 3 pleasanta nd motivated.  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  Plans to return home with  Family/Daughter support. Following commands: Intact      Cueing Cueing Techniques: Verbal cues  Exercises  Total Hip Replacement TE's following HEP Handout 10 reps ankle pumps 05 reps knee presses 05 reps heel slides 05 reps SAQ's 05 reps ABD Instructed how to use a belt loop to assist  Followed by ICE     General Comments        Pertinent Vitals/Pain Pain Assessment Pain Assessment: 0-10 Pain Score: 5  Pain Location: L hip and LE Pain Descriptors / Indicators: Aching, Discomfort, Grimacing, Operative site guarding Pain Intervention(s): Monitored during session, Premedicated before session, Repositioned, Ice applied    Home Living                          Prior Function            PT Goals (current goals can now be found in the care plan section) Progress towards PT goals: Progressing toward goals    Frequency    7X/week      PT Plan      Co-evaluation              AM-PAC PT 6 Clicks Mobility   Outcome Measure  Help needed turning from your back to your side while in a flat bed without using bedrails?: A Little Help needed moving from lying on your back to sitting on the side of a flat bed without using bedrails?: A Little Help needed moving to and from a bed to a chair (including a wheelchair)?: A Little Help needed standing up from a chair using your arms (e.g., wheelchair or bedside chair)?: A Little Help needed to walk in hospital room?: A Little Help needed climbing 3-5 steps with a railing? : A Lot 6 Click Score: 17    End of Session Equipment Utilized During Treatment: Gait belt Activity Tolerance: Patient tolerated treatment well;No increased pain Patient left: in chair;with call bell/phone within reach;with chair alarm set Nurse Communication: Mobility status PT Visit Diagnosis: Unsteadiness on feet (R26.81);Other abnormalities of gait and mobility (R26.89);Muscle weakness (generalized) (M62.81);Difficulty in walking, not  elsewhere classified (R26.2);Pain Pain - Right/Left: Left Pain - part of body: Hip;Leg     Time: 9072-9044 PT Time Calculation (min) (ACUTE ONLY): 28 min  Charges:    $Gait Training: 8-22 mins $Therapeutic Exercise: 8-22 mins PT General Charges $$ ACUTE PT VISIT: 1 Visit                     Katheryn Leap  PTA Acute  Rehabilitation Services Office M-F          [phone]

## 2024-04-30 NOTE — Progress Notes (Signed)
" ° °  Subjective: 1 Day Post-Op Procedures (LRB): ARTHROPLASTY, HIP, TOTAL, ANTERIOR APPROACH (Left) Patient reports pain as mild.   Patient seen in rounds with Dr. Ernie. Patient is resting in bed on exam this morning. No acute events overnight.  Patient ambulated 50 feet with PT yesterday. We will start therapy today.   Objective: Vital signs in last 24 hours: Temp:  [97.4 F (36.3 C)-98.5 F (36.9 C)] 98.3 F (36.8 C) (01/21 0602) Pulse Rate:  [60-106] 80 (01/21 0602) Resp:  [14-19] 18 (01/21 0602) BP: (105-139)/(53-91) 114/59 (01/21 0602) SpO2:  [93 %-100 %] 95 % (01/21 0602) Weight:  [73 kg] 73 kg (01/20 1537)  Intake/Output from previous day:  Intake/Output Summary (Last 24 hours) at 04/30/2024 0812 Last data filed at 04/30/2024 0600 Gross per 24 hour  Intake 2850.17 ml  Output 3275 ml  Net -424.83 ml     Intake/Output this shift: No intake/output data recorded.  Labs: Recent Labs    04/30/24 0328  HGB 11.0*   Recent Labs    04/30/24 0328  WBC 16.4*  RBC 3.76*  HCT 33.5*  PLT 280   Recent Labs    04/30/24 0328  NA 140  K 3.9  CL 105  CO2 22  BUN 15  CREATININE 1.18  GLUCOSE 162*  CALCIUM 8.8*   No results for input(s): LABPT, INR in the last 72 hours.  Exam: General - Patient is Alert and Oriented Extremity - Neurologically intact Sensation intact distally Intact pulses distally Dorsiflexion/Plantar flexion intact Dressing - dressing C/D/I Motor Function - intact, moving foot and toes well on exam.   Past Medical History:  Diagnosis Date   Acute urinary retention    Arthritis    Bladder stone    Foley catheter in place    History of kidney stones    History of pulmonary embolus (PE) 04/10/2000   BILATERAL--  TX W/ COUMADIN   History of radiation therapy    01/29/24 - 02/25/24 Lip- Dr. 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   Weight as of this encounter: 73 kg. Advance diet Up with therapy  DVT Prophylaxis - Eliquis  Weight bearing as tolerated.  Foley to remain in place  Awaiting outpatient follow up with Dr. Cam  LE edema improved today after IV lasix  intra-op yesterday Back on home diuretic today  Plan is to go Home after hospital stay. Plan for discharge today after meeting goals with therapy. Follow up in the office in 2 weeks.   Rosina Calin, PA-C Orthopedic Surgery [phone] 04/30/2024, 8:12 AM  "

## 2024-05-01 ENCOUNTER — Encounter (HOSPITAL_COMMUNITY): Payer: Self-pay | Admitting: Orthopedic Surgery

## 2024-05-01 NOTE — Anesthesia Postprocedure Evaluation (Signed)
"   Anesthesia Post Note  Patient: Benjamin Massey  Procedure(s) Performed: ARTHROPLASTY, HIP, TOTAL, ANTERIOR APPROACH (Left: Hip)     Patient location during evaluation: PACU Anesthesia Type: MAC and Spinal Level of consciousness: awake and alert Pain management: pain level controlled Vital Signs Assessment: post-procedure vital signs reviewed and stable Respiratory status: spontaneous breathing, nonlabored ventilation and respiratory function stable Cardiovascular status: stable and blood pressure returned to baseline Postop Assessment: no apparent nausea or vomiting Anesthetic complications: no   No notable events documented.                  Boden Stucky      "

## 2024-05-08 NOTE — Discharge Summary (Signed)
 Patient ID: Benjamin Massey MRN: [id number] DOB/AGE: June 05, 1945 79 y.o.  Admit date: 04/29/2024 Discharge date: 04/30/2024  Admission Diagnoses:  Left hip osteoarthritis  Discharge Diagnoses:  Principal Problem:   S/P total left hip arthroplasty   Past Medical History:  Diagnosis Date   Acute urinary retention    Arthritis    Bladder stone    Foley catheter in place    History of kidney stones    History of pulmonary embolus (PE) 04/10/2000   BILATERAL--  TX W/ COUMADIN   History of radiation therapy    01/29/24 - 02/25/24 Lip- Dr. Lauraine Golden, MD   Hypercholesteremia    Hypertension    Pre-diabetes    Prostate cancer [facility])    Skin cancer    Solitary kidney    s/p right nephrectomy  age 23 right ureter absent    Surgeries: Procedures: ARTHROPLASTY, HIP, TOTAL, ANTERIOR APPROACH on 04/29/2024   Consultants:   Discharged Condition: Improved  Hospital Course: TEAGON KRON is an 79 y.o. male who was admitted 04/29/2024 for operative treatment ofS/P total left hip arthroplasty. Patient has severe unremitting pain that affects sleep, daily activities, and work/hobbies. After pre-op clearance the patient was taken to the operating room on 04/29/2024 and underwent  Procedures: ARTHROPLASTY, HIP, TOTAL, ANTERIOR APPROACH.    Patient was given perioperative antibiotics:  Anti-infectives (From admission, onward)    Start     Dose/Rate Route Frequency Ordered Stop   04/29/24 1730  ceFAZolin  (ANCEF ) IVPB 2g/100 mL premix        2 g 200 mL/hr over 30 Minutes Intravenous Every 6 hours 04/29/24 1536 04/29/24 2331   04/29/24 0900  ceFAZolin  (ANCEF ) IVPB 2g/100 mL premix        2 g 200 mL/hr over 30 Minutes Intravenous On call to O.R. 04/29/24 0846 04/29/24 1134   04/29/24 0849  ceFAZolin  (ANCEF ) 2-4 GM/100ML-% IVPB       Note to Pharmacy: Roma Sharper K: cabinet override      04/29/24 0849 04/29/24 1126        Patient was given sequential compression devices, early  ambulation, and chemoprophylaxis to prevent DVT. Patient worked with PT and was meeting their goals regarding safe ambulation and transfers.  Patient benefited maximally from hospital stay and there were no complications.    Recent vital signs: No data found.   Recent laboratory studies: No results for input(s): WBC, HGB, HCT, PLT, NA, K, CL, CO2, BUN, CREATININE, GLUCOSE, INR, CALCIUM in the last 72 hours.  Invalid input(s): PT, 2   Discharge Medications:   Allergies as of 04/30/2024       Reactions   Atorvastatin Calcium    Other Reaction(s): burning in head, unable to eat, lost 10 lbs    Fenofibrate Cough   Olmesartan Cough   Rosuvastatin    Dizziness (even while driving), achy, unable to eat, lost 10 lbs        Medication List     TAKE these medications    acetaminophen  325 MG tablet Commonly known as: TYLENOL  Take 650 mg by mouth every 6 (six) hours as needed for moderate pain (pain score 4-6).   amLODipine  10 MG tablet Commonly known as: NORVASC  Take 10 mg by mouth every morning.   Co Q-10 100 MG Caps Take 100 mg by mouth daily.   COCONUT OIL PO Take 1 tablet by mouth daily.   CURCUMIN 95 PO Take 2 capsules by mouth daily.   DYNAMIC CO Take 2  capsules by mouth daily.   Eliquis  2.5 MG Tabs tablet Generic drug: apixaban  Take 1 tablet (2.5 mg total) by mouth every 12 (twelve) hours.   Krill Oil 1000 MG Caps Take 2,000 mg by mouth 2 (two) times daily.   magnesium oxide 400 MG tablet Commonly known as: MAG-OX Take 400 mg by mouth every morning.   methocarbamol  500 MG tablet Commonly known as: ROBAXIN  Take 1 tablet (500 mg total) by mouth every 6 (six) hours as needed for muscle spasms (thigh pain).   oxyCODONE  5 MG immediate release tablet Commonly known as: Oxy IR/ROXICODONE  Take 1 tablet (5 mg total) by mouth every 4 (four) hours as needed for severe pain (pain score 7-10).   polyethylene glycol powder 17 GM/SCOOP  powder Commonly known as: GLYCOLAX /MIRALAX  Dissolve 1 capful (17g) in 4-8 ounces of liquid and take by mouth 2 (two) times daily.   PROSTATE HEALTH PO Take 2 capsules by mouth daily.   senna 8.6 MG Tabs tablet Commonly known as: SENOKOT Take 2 tablets (17.2 mg total) by mouth at bedtime for 14 days.   triamterene -hydrochlorothiazide  37.5-25 MG tablet Commonly known as: MAXZIDE -25 Take 1 tablet by mouth every morning.   Vitamin D 50 MCG (2000 UT) Caps Take 2,000 Units by mouth 2 (two) times daily.   VITEYES ESSENTIALS VISION SUPP PO Take 1 capsule by mouth daily.               Discharge Care Instructions  (From admission, onward)           Start     Ordered   04/30/24 0000  Change dressing       Comments: Maintain surgical dressing until follow up in the clinic. If the edges start to pull up, may reinforce with tape. If the dressing is no longer working, may remove and cover with gauze and tape, but must keep the area dry and clean.  Call with any questions or concerns.   04/30/24 0815            Diagnostic Studies: DG Pelvis Portable Result Date: 04/29/2024 CLINICAL DATA:  Postop. EXAM: PORTABLE PELVIS 1-2 VIEWS COMPARISON:  08/08/2023 FINDINGS: Left hip arthroplasty in expected alignment. No periprosthetic lucency or fracture. Recent postsurgical change includes air and edema in the soft tissues. Catheter overlies the pelvis. IMPRESSION: Left hip arthroplasty without immediate postoperative complication. Electronically Signed   By: Andrea Gasman M.D.   On: 04/29/2024 14:23   DG HIP UNILAT WITH PELVIS 1V LEFT Result Date: 04/29/2024 CLINICAL DATA:  Elective surgery. EXAM: DG HIP (WITH OR WITHOUT PELVIS) 1V*L* COMPARISON:  08/08/2023 FINDINGS: Three fluoroscopic spot views of the pelvis and left hip obtained in the operating room. Images during hip arthroplasty. Fluoroscopy time 10 seconds. Dose 1.4 mGy. IMPRESSION: Intraoperative fluoroscopy during left hip  arthroplasty. Electronically Signed   By: Andrea Gasman M.D.   On: 04/29/2024 14:22   DG C-Arm 1-60 Min-No Report Result Date: 04/29/2024 Fluoroscopy was utilized by the requesting physician.  No radiographic interpretation.    Disposition: Discharge disposition: 01-Home or Self Care       Discharge Instructions     Call MD / Call 911   Complete by: As directed    If you experience chest pain or shortness of breath, CALL 911 and be transported to the hospital emergency room.  If you develope a fever above 101 F, pus (white drainage) or increased drainage or redness at the wound, or calf pain, call your surgeon's office.  Change dressing   Complete by: As directed    Maintain surgical dressing until follow up in the clinic. If the edges start to pull up, may reinforce with tape. If the dressing is no longer working, may remove and cover with gauze and tape, but must keep the area dry and clean.  Call with any questions or concerns.   Constipation Prevention   Complete by: As directed    Drink plenty of fluids.  Prune juice may be helpful.  You may use a stool softener, such as Colace (over the counter) 100 mg twice a day.  Use MiraLax  (over the counter) for constipation as needed.   Diet - low sodium heart healthy   Complete by: As directed    Increase activity slowly as tolerated   Complete by: As directed    Weight bearing as tolerated with assist device (walker, cane, etc) as directed, use it as long as suggested by your surgeon or therapist, typically at least 4-6 weeks.   Post-operative opioid taper instructions:   Complete by: As directed    POST-OPERATIVE OPIOID TAPER INSTRUCTIONS: It is important to wean off of your opioid medication as soon as possible. If you do not need pain medication after your surgery it is ok to stop day one. Opioids include: Codeine, Hydrocodone (Norco, Vicodin), Oxycodone (Percocet, oxycontin ) and hydromorphone  amongst others.  Long term and even  short term use of opiods can cause: Increased pain response Dependence Constipation Depression Respiratory depression And more.  Withdrawal symptoms can include Flu like symptoms Nausea, vomiting And more Techniques to manage these symptoms Hydrate well Eat regular healthy meals Stay active Use relaxation techniques(deep breathing, meditating, yoga) Do Not substitute Alcohol to help with tapering If you have been on opioids for less than two weeks and do not have pain than it is ok to stop all together.  Plan to wean off of opioids This plan should start within one week post op of your joint replacement. Maintain the same interval or time between taking each dose and first decrease the dose.  Cut the total daily intake of opioids by one tablet each day Next start to increase the time between doses. The last dose that should be eliminated is the evening dose.      TED hose   Complete by: As directed    Use stockings (TED hose) for 2 weeks on both leg(s).  You may remove them at night for sleeping.        Follow-up Information     Patti Rosina SAUNDERS, PA-C. Go on 05/14/2024.   Specialty: Orthopedic Surgery Why: You are scheduled for a post op appointment on Wednesday 05/14/24 at 2:15pm Contact information: [address] [place] [address] [phone]         Cam Morene ORN, MD. Call.   Specialty: Urology Contact information: [address] [place] [address] [phone]                  Signed: Rosina SAUNDERS Patti 05/08/2024, 10:55 AM
